# Patient Record
Sex: Male | Born: 1940 | Race: White | Hispanic: Yes | Marital: Married | Smoking: Never smoker
Health system: Southern US, Community
[De-identification: ages and names within clinical notes are randomized; demographics above are authoritative.]

## PROBLEM LIST (undated history)

## (undated) DIAGNOSIS — Z5189 Encounter for other specified aftercare: Secondary | ICD-10-CM

## (undated) DIAGNOSIS — R011 Cardiac murmur, unspecified: Secondary | ICD-10-CM

## (undated) DIAGNOSIS — M1990 Unspecified osteoarthritis, unspecified site: Secondary | ICD-10-CM

## (undated) DIAGNOSIS — J349 Unspecified disorder of nose and nasal sinuses: Secondary | ICD-10-CM

## (undated) DIAGNOSIS — H269 Unspecified cataract: Secondary | ICD-10-CM

## (undated) DIAGNOSIS — I341 Nonrheumatic mitral (valve) prolapse: Secondary | ICD-10-CM

## (undated) DIAGNOSIS — I10 Essential (primary) hypertension: Secondary | ICD-10-CM

## (undated) DIAGNOSIS — E785 Hyperlipidemia, unspecified: Secondary | ICD-10-CM

## (undated) HISTORY — DX: Unspecified cataract: H26.9

## (undated) HISTORY — DX: Hyperlipidemia, unspecified: E78.5

## (undated) HISTORY — DX: Encounter for other specified aftercare: Z51.89

## (undated) HISTORY — DX: Unspecified osteoarthritis, unspecified site: M19.90

## (undated) HISTORY — DX: Unspecified disorder of nose and nasal sinuses: J34.9

## (undated) HISTORY — PX: EYE SURGERY: SHX253

## (undated) HISTORY — DX: Cardiac murmur, unspecified: R01.1

## (undated) HISTORY — DX: Essential (primary) hypertension: I10

## (undated) HISTORY — PX: OTHER SURGICAL HISTORY: SHX169

## (undated) HISTORY — DX: Nonrheumatic mitral (valve) prolapse: I34.1

---

## 2007-05-21 ENCOUNTER — Encounter: Admission: RE | Admit: 2007-05-21 | Discharge: 2007-05-21 | Payer: Self-pay | Admitting: Gastroenterology

## 2009-06-11 IMAGING — CT CT VIRTUAL COLONOSCOPY DIAGNOSTIC
2 of 5 series · 16 of 46 positions shown, 18 images · non-contrast
Comparison: None

CLINICAL DATA: CT VIRTUAL COLONOSCOPY FOR SCREENING:
TECHNIQUE: The patient was given a standard  bowel preparation
with Gastrografin and barium for fluid and stool tagging
respectively.  The quality of the bowel preparation is .moderate.
Automated CO2 insufflation of the colon was performed prior to
image acquisition and colonic distention is moderate.
CLINICAL DATA: CT ABDOMEN AND PELVIS WITHOUT CONTRAST

CT ABDOMEN

[Series 2: supine · axial · 0.70mm/px · z∈[-380,+36]mm · 13 of 371 slices shown, 15 images]
[im 19/371  soft-tissue]
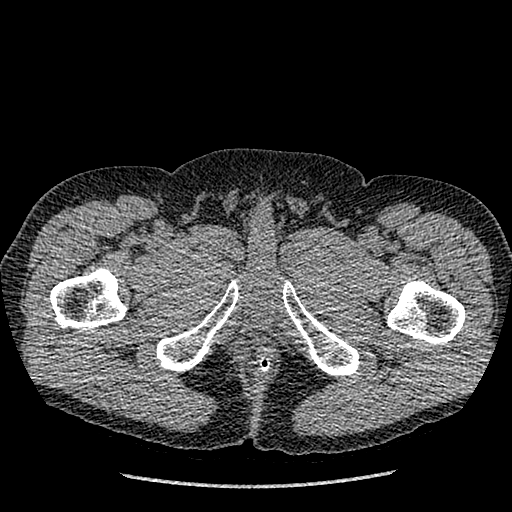
[im 19/371  bone]
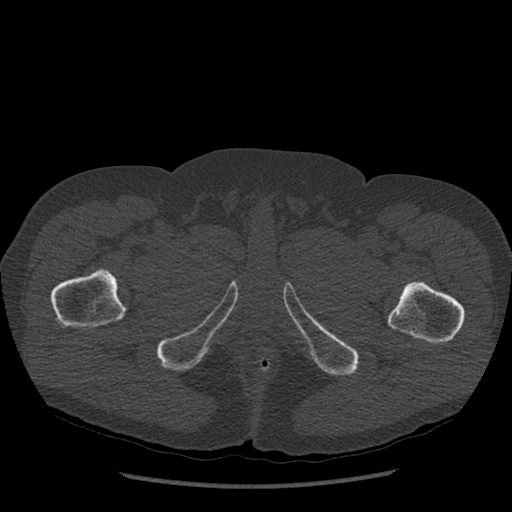
[im 56/371  soft-tissue]
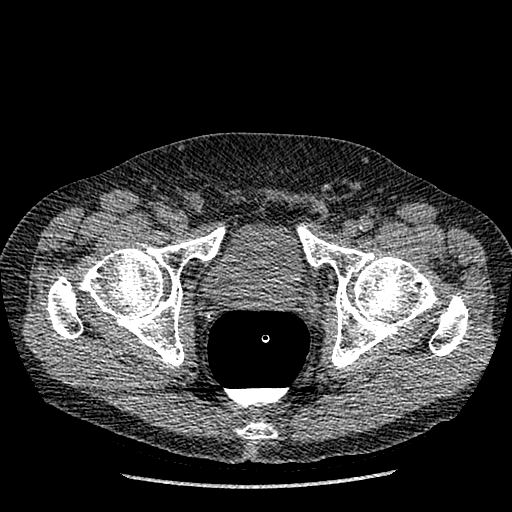
[im 75/371  soft-tissue]
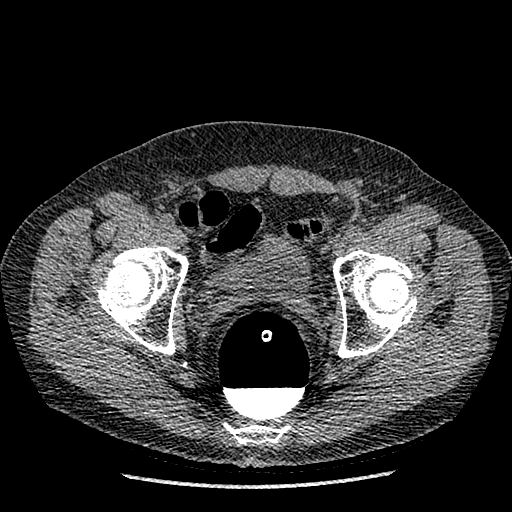
[im 112/371  soft-tissue]
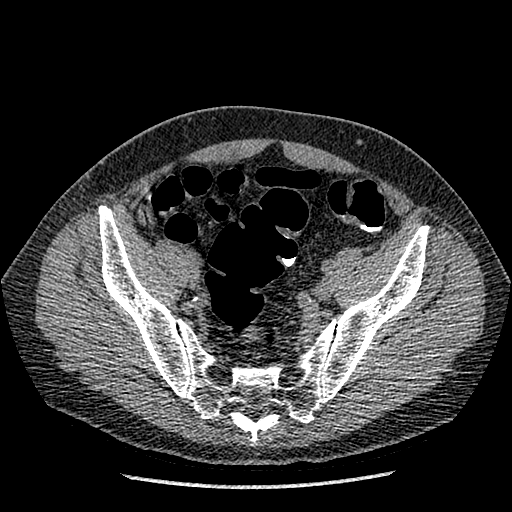
[im 130/371  soft-tissue]
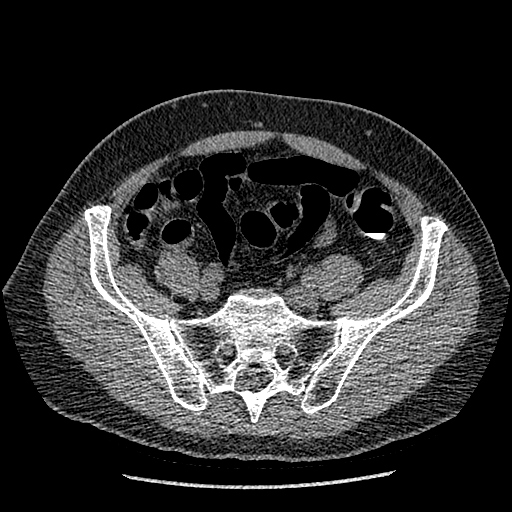
[im 167/371  soft-tissue]
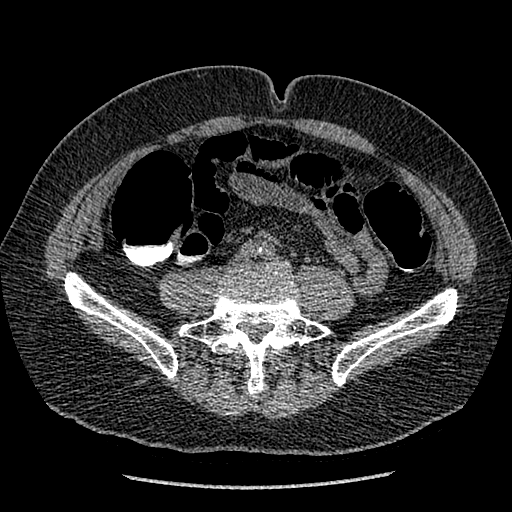
[im 186/371  soft-tissue]
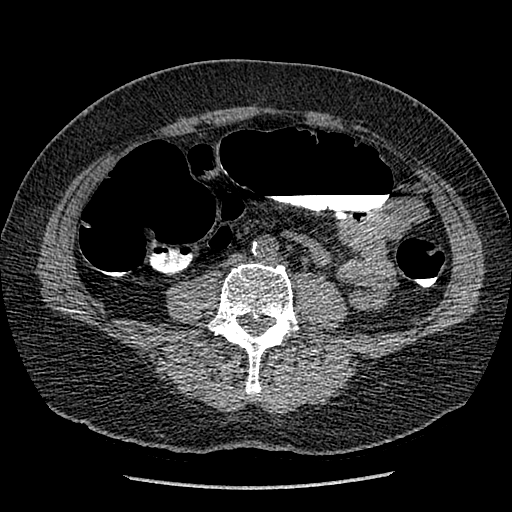
[im 204/371  soft-tissue]
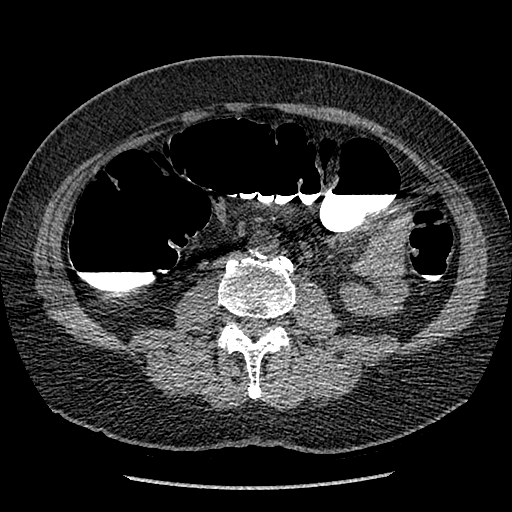
[im 241/371  soft-tissue]
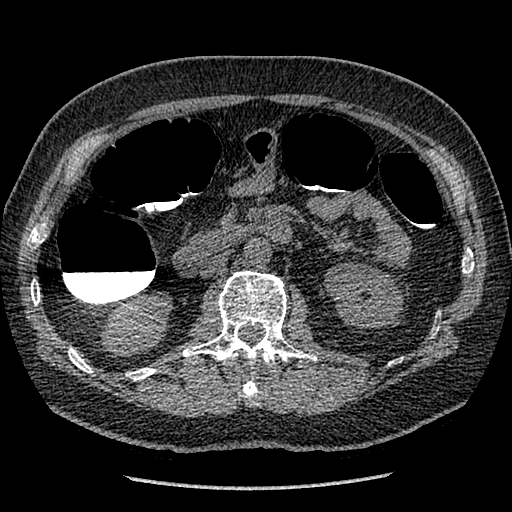
[im 241/371  bone]
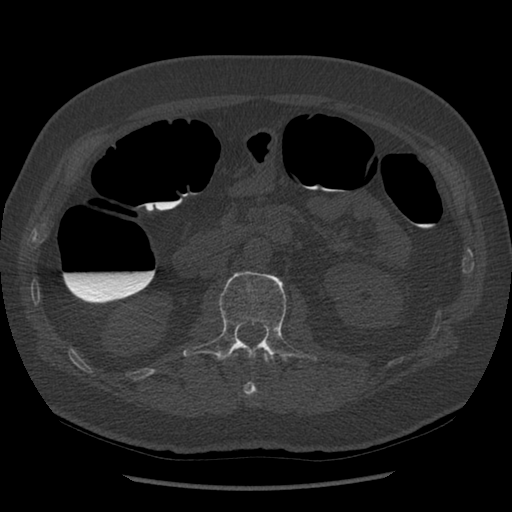
[im 260/371  soft-tissue]
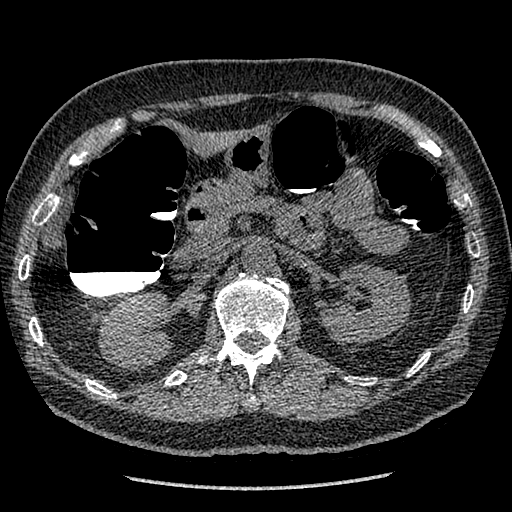
[im 297/371  soft-tissue]
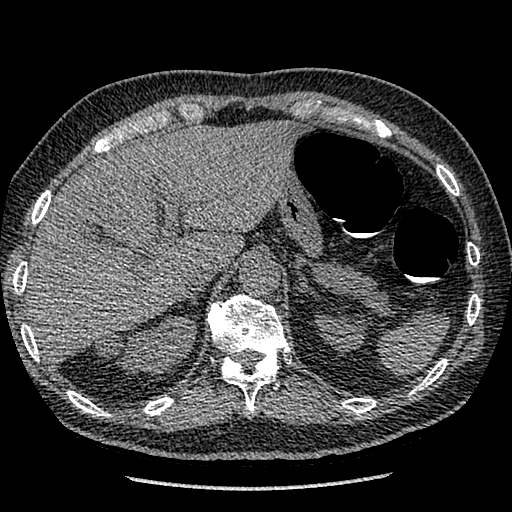
[im 315/371  soft-tissue]
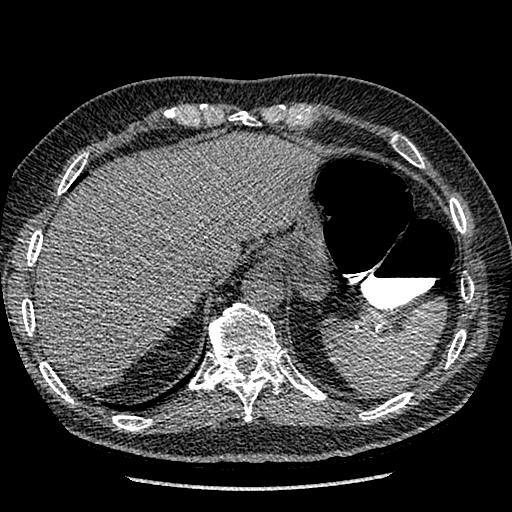
[im 352/371  soft-tissue]
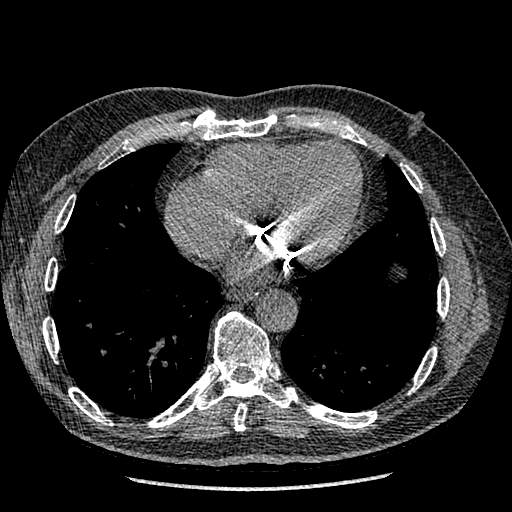

[Series 602: sagittal body · sagittal · 0.90mm/px · 3 of 145 slices shown]
[im 49/145  soft-tissue]
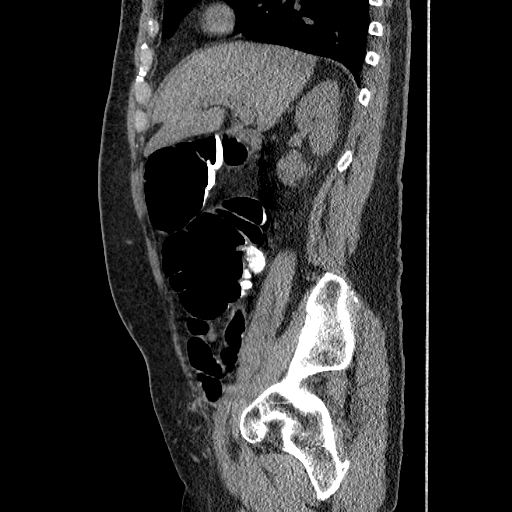
[im 65/145  soft-tissue]
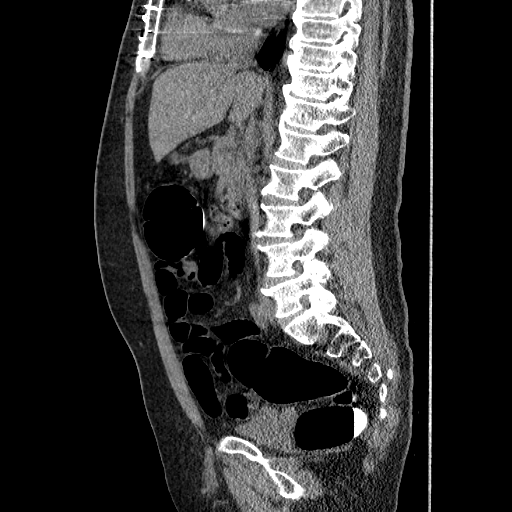
[im 81/145  soft-tissue]
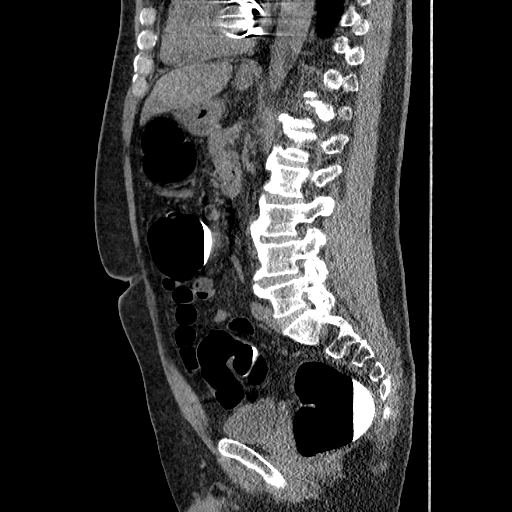

[16 of 46 positions shown; findings below may reference images not displayed]

FINDINGS: During the supine phase of the study the patient became
vasovagal.  Therefore only a supine series could be obtained.  He
was placed in the Trendelenburg position and given IV fluids and
[REDACTED] juice, and over the next and 15 minutes did  respond well.
However after discussing whether or not to proceed with the prone
imaging of the decision was made to forego that part of the exam,
resulting in a somewhat compromised study.

No clinically significant polyp is seen and no constricting lesion
is noted.  Small amount of retained feces and tag to of fluid is
present.  Scattered sigmoid colonic diverticula.

This study is considered a C-Rads category C1,i.e.  normal colon,
continue routine screening.
IMPRESSION: Somewhat compromised study since only the supine portion could be
  performed as described above.  However, no clinically significant
                                polyp is seen.  C-Rads category C1.

Virtual colonoscopy is not designed to detect diminutive polyps
(i.e., less than or equal to 5 mm), the presence or absence of
which may not affect clinical management.
FINDINGS: The lung bases are clear.  The liver appears normal in
the unenhanced state.  No calcified gallstones are seen.  The
pancreas is normal in size and the pancreatic duct is not dilated.
The adrenal glands and spleen appear normal with a few small
calcified splenic granulomas present.  There is a rounded mass
emanating from the upper pole of the right kidney laterally which
has attenuation of [place] units measuring 2.1 cm in diameter.
This probably represents a cyst but is difficult to evaluate on
this unenhanced exam.  Ultrasound may be helpful to assess further.
No hydronephrosis is noted.  Or lumbar spine.
IMPRESSION: 1.  Probable 2.1 cm cyst in the upper pole of the right kidney.
Consider ultrasound to assess further.

CT PELVIS
FINDINGS: No significant abnormality on unenhanced CT the pelvis.
The appendix appears normal.  The urinary bladder is decompressed.
No pelvic mass or adenopathy is seen.
IMPRESSION: No significant abnormality.  Appendix appears normal.

## 2013-03-05 ENCOUNTER — Encounter: Payer: Self-pay | Admitting: Podiatrist

## 2013-03-05 ENCOUNTER — Ambulatory Visit (INDEPENDENT_AMBULATORY_CARE_PROVIDER_SITE_OTHER): Payer: Medicare HMO | Admitting: Podiatrist

## 2013-03-05 VITALS — BP 127/74 | HR 72 | Resp 16

## 2013-03-05 DIAGNOSIS — M216X9 Other acquired deformities of unspecified foot: Secondary | ICD-10-CM

## 2013-03-05 DIAGNOSIS — Q828 Other specified congenital malformations of skin: Secondary | ICD-10-CM

## 2013-03-05 NOTE — Progress Notes (Signed)
   Subjective:    Patient ID: Sean [name], male    DOB: 1941-01-11, 73 y.o.   MRN: [id number]  HPI trim my callus on my ball of the left foot and hurts if I am on it and it gets inflammed and hurts with pressure    Review of Systems  Constitutional: Negative.   HENT: Negative.   Eyes: Negative.   Respiratory: Negative.   Cardiovascular: Negative.   Gastrointestinal: Negative.   Endocrine: Negative.   Genitourinary: Negative.   Musculoskeletal: Negative.   Skin: Negative.   Allergic/Immunologic: Negative.   Neurological: Negative.   Hematological: Bruises/bleeds easily.  Psychiatric/Behavioral: Negative.        Objective:   Physical Exam Neurovascular status is intact with palpable symmetric pedal pulses and neurological sensation intact. Hyperkeratotic lesion is present submetatarsal 5 of the left foot. Once agreed intact integument is noted. It does appear that his shoes are well worn out and the lesion may be attributed to this as well.       Assessment & Plan:  Callus submet 5 left foot  Debrided lesion with a 15 blade without complication.  Padding added to insert of existing shoe and patient instructed on getting new shoes as the ones he has appear worn.

## 2013-07-04 ENCOUNTER — Ambulatory Visit (INDEPENDENT_AMBULATORY_CARE_PROVIDER_SITE_OTHER): Payer: Medicare HMO

## 2013-07-04 VITALS — BP 116/62 | HR 62 | Resp 18

## 2013-07-04 DIAGNOSIS — M216X9 Other acquired deformities of unspecified foot: Secondary | ICD-10-CM

## 2013-07-04 DIAGNOSIS — Q828 Other specified congenital malformations of skin: Secondary | ICD-10-CM

## 2013-07-04 NOTE — Patient Instructions (Signed)
Corns and Calluses A thickening of the skin layer (usually over bony areas, such as toe joints) is known as a corn. Two types of corns exist: hard corns and soft corns. Calluses are painless areas of skin thickening that are caused by repeated pressure or irritation. Corns tend to affect toe joints and the skin between the toes; whereas, a callus can appear on any part of the body (especially the hands, feet, or knees).  SYMPTOMS   Corn:  Presence of a small (1/8 to 3/8 inch [3 to 10 mm in diameter]), painful bump on the side or over the joint of a toe.  Hard corns are more common on the outer portion of the little (fifth) toe at the joint.  Soft corns are more common between bony bumps (prominences), usually between the fourth and fifth toes or between the second and third toes.  Callus:  A rough, thickened area of skin that appears after repeated pressure or irritation. CAUSES  The purpose of corns and calluses is to protect an area of skin from injury caused by repeated irritation (rubbing or squeezing). The presence of pressure causes the skin cells to grow at a faster rate than the cells of unaffected areas. This leads to an overgrowth (corn or callus). As apposed to hard corns, soft corns tend to develop between toes, because there is more moisture. Soft corns are often the result of prolonged shoe wear, which leads to increased perspiration and moisture.  RISK INCREASES WITH:  Shoes that are too tight.  Occupations or sports that involve repetitive pressure on the hands (racquetball and baseball) or sudden stops on hard surfaces (track and tennis).  Sports that require the athlete to wear shoes, perspire, or wear clothing or protective gear that causes the production of heat and friction. PREVENTION  Properly fitted shoes and equipment.  Modify activities to prevent constant pressure on specific areas of skin.  If possible, wear padding over areas of skin that are exposed to  repeated pressure or irritation.  Keep the area between the toes dry (with powder or by removing shoes often).  Relieve shoe pressure by stretching the areas of the shoe that cause the pressure and or use ointments to soften leather shoes. PROGNOSIS  Corns and calluses typically subside if the activity that causes them is eliminated. Recovery may take up to 3 weeks. Recurrence is likely even with treatment if the cause is not removed.  RELATED COMPLICATIONS  If one overcompensates in an attempt to avoid pain, he or she may experience pain in other areas due to the changes in body movements (mechanics). TREATMENT  The best way to treat corns and calluses is to remove the source of pressure. Corn and callus pads may be helpful in reducing pressure on the affected skin. For soft corns, try to keep the affected area dry. If you cannot find shoes that fit properly, a shoe repair shop may be able to alter your shoes to reduce pressure. Occasionally a cushion for the bottom of the foot (metatarsal bar) worn within the shoe may relieve pressure on corns or calluses of the foot. For calluses, you may be able to peel or rub the thickened area with a pumice stone, sandstone, callus file, or with sandpaper to remove the callus; wetting the affected area may make this process more effective. Do not cut the corn or callus with a razor or knife. If the corn or callus must be removed, then a medically trained person should perform   the procedure. After peeling away the upper layers of a corn once or twice a day, it may be recommended to apply a non-prescription 5% to 10% salicylic ointment and cover the area with a bandage. It very uncommon to have the bony bumps (at toe joints) surgically removed. MEDICATION   If pain medication is necessary, nonsteroidal anti-inflammatory medications, such as aspirin and ibuprofen, or other minor pain relievers, such as acetaminophen, are often recommended. Contact your caregiver  immediately if any bleeding, stomach upset, or signs of an allergic reaction occur.  Topical salicylic ointments (5% to 10%) may be of benefit.  Prescription pain medications may be given by a caregiver. Use only as directed and only as much as you need.  Soak the foot for 20 minutes, twice a day, in a gallon of warm water. This may help to soften corns and calluses. Care should be taken to thoroughly dry the foot, especially between the toes, after soaking. SEEK MEDICAL CARE IF:   Symptoms get worse or do not improve in 2 weeks despite treatment.  Any signs of infection develop, including redness, swelling, increased pain or tenderness, or increased warmth around the corn or callus.  New, unexplained symptoms develop (drugs used in treatment may produce side effects). Document Released: 01/31/2005 Document Revised: 04/25/2011 Document Reviewed: 05/15/2008 Exit[employer] Patient Information 2014 [employer].  

## 2013-07-04 NOTE — Progress Notes (Signed)
   Subjective:    Patient ID: Sean [name], male    DOB: 02-Feb-1941, 73 y.o.   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

## 2014-04-14 ENCOUNTER — Ambulatory Visit (INDEPENDENT_AMBULATORY_CARE_PROVIDER_SITE_OTHER): Payer: Medicare HMO

## 2014-04-14 VITALS — BP 129/78 | HR 70 | Resp 18

## 2014-04-14 DIAGNOSIS — M216X1 Other acquired deformities of right foot: Secondary | ICD-10-CM

## 2014-04-14 DIAGNOSIS — Q828 Other specified congenital malformations of skin: Secondary | ICD-10-CM

## 2014-04-14 DIAGNOSIS — M2041 Other hammer toe(s) (acquired), right foot: Secondary | ICD-10-CM

## 2014-04-14 NOTE — Progress Notes (Signed)
   Subjective:    Patient ID: Sean [name], male    DOB: February 28, 1940, 74 y.o.   MRN: [id number]  HPI I HAVE A CORN ON MY 5TH RIGHT TOE AND I WEAR A SPACER INBETWEEN MY 4TH AND 5TH TOE AND IT ITCHES AND STINGS AND BURNS AND BY THE END OF THE DAY I AM LIMPING AND ACHES AND I GOT NEW SHOES AND IT DOESN'T REALLY HELP    Review of Systems no new findings or systemic changes noted     Objective:   Physical Exam Neurovascular status unchanged pedal pulses are palpable DP +2 over 4 PT 1 over 4 bilateral Refill time 3 seconds on exam this time there still some splaying of the third and fourth toes with lateral deviation of the fourth and fifth digits there is keratoses on the circumflex on the distal IP joint both medial and lateral fifth toe distal IP joint. There is hemorrhage a keratoses medially consistent with a soft corn in a poor keratotic lesion corn almost blisters corn lateral fifth toe at the IP joint. She's been wearing a toe separator foam pads or [employer] been several months since patient was last seen this pain continues. Problem although is wearing certain shoes like the new balance they're more accommodating and working better however continues to have pain and recurrence of keratoses. 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 This time the keratotic lesions are debrided silicone sleeve or tube foam pads are dispensed to cushion the toe and prevent recurrence however this continues to be an issue may be candidate for possible ostectomy or exostectomy of the distal phalanx exostoses fifth digit right foot will consider whether is improving or not if no improvement follow-up for possible surgical intervention as recommended otherwise consider  continue conservative and accommodative therapies multiple silicone pads and tube foam pads dispensed  Harriet Masson DPM

## 2014-05-26 ENCOUNTER — Ambulatory Visit (INDEPENDENT_AMBULATORY_CARE_PROVIDER_SITE_OTHER): Payer: Medicare HMO | Admitting: Podiatry

## 2014-05-26 ENCOUNTER — Ambulatory Visit: Payer: Medicare HMO

## 2014-05-26 ENCOUNTER — Encounter: Payer: Self-pay | Admitting: Podiatry

## 2014-05-26 VITALS — BP 131/75 | HR 61 | Resp 18

## 2014-05-26 DIAGNOSIS — M2041 Other hammer toe(s) (acquired), right foot: Secondary | ICD-10-CM | POA: Diagnosis not present

## 2014-05-26 DIAGNOSIS — D169 Benign neoplasm of bone and articular cartilage, unspecified: Secondary | ICD-10-CM | POA: Diagnosis not present

## 2014-05-27 ENCOUNTER — Telehealth: Payer: Self-pay | Admitting: *Deleted

## 2014-05-27 NOTE — Progress Notes (Signed)
Subjective:     Patient ID: Sean [name], male   DOB: 1940/08/12, 74 y.o.   MRN: [id number]  HPI patient presents with spur on the right fifth toe and fourth toe that had been very painful for him and states that he cannot take the pain anymore and the trimming and padding are not working and he cannot wear shoe gear   Review of Systems     Objective:   Physical Exam Neurovascular status intact muscle strength adequate range of motion within normal limits. Patient has severe keratotic lesion distal fifth toe right medial side and fourth toe right there is a keratotic lesion on the inner side of the toe that's painful. 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 He's not interested in any more conservative options and wants this fixed and I recommended exostectomy fifth digit right and arthroplasty fourth digit right. I reviewed the procedures and reviewed risk and patient wants surgery understanding all complications as outlined in the consent form and is willing to accept risk and signs consent form. Patient will work with his family doctor concerning his Coumadin and we will have him stop it 4 days before and begin Lovenox which will be coordinated by his family physician he will call if he has any questions or his physician is encouraged to call if there's any questions. Patient understands total recovery. We'll take 6 months to one year

## 2014-05-27 NOTE — Telephone Encounter (Signed)
"  I'm calling to see when I can schedule surgery."  We have you scheduled for 06/10/2014.  "What time do I need to be there?"  The surgical center will call you a few days before and let you know exactly what time to get there.  "Okay, that is great.  I will have this surgery that Wednesday and have surgery on my neck the next day.  That way I will only have to be off Coumadin one time."

## 2014-06-06 ENCOUNTER — Telehealth: Payer: Self-pay | Admitting: *Deleted

## 2014-06-06 NOTE — Telephone Encounter (Signed)
I called and spoke to [facility] to get authorization of patient's surgery.  She took information and then informed me I needed to speak to someone from [place] to get procedure 20285 authorized.  [id number] doesn't need authorization.  I spoke to Lyman at [facility] and she stated I had to fill out a form.  She's going to fax form and requested I send clinical notes to support.

## 2014-06-09 NOTE — Telephone Encounter (Signed)
I called to check on the status of authorization for Hammertoe procedure.  "It's still under review."

## 2014-06-09 NOTE — Telephone Encounter (Signed)
I called to check on status of authorization for [name].  "It has been approved.  It's good from 06/09/2014 - 07/25/2014.  Authorization number is [id number].    I called and informed Sean [name] about authorization.  "Okay, I'll make a note of it.  Thank you."

## 2014-06-10 DIAGNOSIS — M2041 Other hammer toe(s) (acquired), right foot: Secondary | ICD-10-CM | POA: Diagnosis not present

## 2014-06-10 DIAGNOSIS — M25774 Osteophyte, right foot: Secondary | ICD-10-CM | POA: Diagnosis not present

## 2014-06-16 ENCOUNTER — Ambulatory Visit (INDEPENDENT_AMBULATORY_CARE_PROVIDER_SITE_OTHER): Payer: Medicare HMO

## 2014-06-16 ENCOUNTER — Encounter: Payer: Self-pay | Admitting: Podiatry

## 2014-06-16 ENCOUNTER — Ambulatory Visit (INDEPENDENT_AMBULATORY_CARE_PROVIDER_SITE_OTHER): Payer: Medicare HMO | Admitting: Podiatry

## 2014-06-16 VITALS — BP 153/71 | HR 75 | Resp 15

## 2014-06-16 DIAGNOSIS — Z9889 Other specified postprocedural states: Secondary | ICD-10-CM

## 2014-06-16 DIAGNOSIS — B351 Tinea unguium: Secondary | ICD-10-CM

## 2014-06-16 DIAGNOSIS — M2041 Other hammer toe(s) (acquired), right foot: Secondary | ICD-10-CM

## 2014-06-16 DIAGNOSIS — D169 Benign neoplasm of bone and articular cartilage, unspecified: Secondary | ICD-10-CM

## 2014-06-17 NOTE — Progress Notes (Signed)
Subjective:     Patient ID: Sean [name], male   DOB: 02-16-1940, 74 y.o.   MRN: [id number]  HPI patient states I'm doing real well with my surgery and having minimal discomfort or swelling   Review of Systems     Objective:   Physical Exam Neurovascular status intact negative Homans sign noted with fourth and fifth toe digits and good alignment with wound edges well coapted and stitches in place    Assessment:     Doing well post arthroplasty fourth exostectomy fifth toe right    Plan:     Reviewed conditions and reapplied sterile dressing after reviewing x-rays. Continue with elevation and reappoint 2 weeks for suture removal or earlier if any issues should occur

## 2014-06-30 ENCOUNTER — Encounter: Payer: Self-pay | Admitting: Podiatry

## 2014-06-30 ENCOUNTER — Ambulatory Visit (INDEPENDENT_AMBULATORY_CARE_PROVIDER_SITE_OTHER): Payer: Medicare HMO | Admitting: Podiatry

## 2014-06-30 DIAGNOSIS — M2041 Other hammer toe(s) (acquired), right foot: Secondary | ICD-10-CM

## 2014-06-30 NOTE — Progress Notes (Signed)
Subjective:     Patient ID: Sean [name], male   DOB: 08/21/1940, 74 y.o.   MRN: [id number]  HPI patient states I'm doing great with my toes   Review of Systems     Objective:   Physical Exam Neurovascular status intact muscle strength adequate with excellent healing of the fourth and fifth toes right foot with digits and good alignment wound edges well coapted and no pain    Assessment:     Doing well post arthroplasty fourth exostectomy fifth right foot    Plan:     Reviewed condition and recommended return to normal activity and gradual return to work in the next 2 weeks. Doing very well with surgery and will be seen back as needed

## 2016-02-24 ENCOUNTER — Ambulatory Visit (INDEPENDENT_AMBULATORY_CARE_PROVIDER_SITE_OTHER): Payer: Medicare HMO | Admitting: Sports Medicine

## 2016-02-24 ENCOUNTER — Ambulatory Visit (INDEPENDENT_AMBULATORY_CARE_PROVIDER_SITE_OTHER): Payer: Medicare HMO

## 2016-02-24 ENCOUNTER — Encounter: Payer: Self-pay | Admitting: Sports Medicine

## 2016-02-24 DIAGNOSIS — M79671 Pain in right foot: Secondary | ICD-10-CM

## 2016-02-24 DIAGNOSIS — M79672 Pain in left foot: Secondary | ICD-10-CM

## 2016-02-24 DIAGNOSIS — G629 Polyneuropathy, unspecified: Secondary | ICD-10-CM | POA: Diagnosis not present

## 2016-02-24 MED ORDER — GABAPENTIN 300 MG PO CAPS
300.0000 mg | ORAL_CAPSULE | Freq: Every day | ORAL | 3 refills | Status: DC
Start: 2016-02-24 — End: 2016-03-24

## 2016-02-24 NOTE — Progress Notes (Signed)
Subjective: Sean [name] is a 76 y.o. male patient who presents to office for evaluation of bilateral foot pain. Patient complains of progressive pain especially over the last few months as sharp, stabbing, tingling pain to all toes on both feet worse after work at end of day. Patient has tried change of shoes with no relief in symptoms. Patient denies any other pedal complaints. Denies injury/trip/fall/sprain/any causative factors.   There are no active problems to display for this patient.   Current Outpatient Prescriptions on File Prior to Visit  Medication Sig Dispense Refill  . acetaminophen (TYLENOL) 325 MG tablet Take by mouth.    [place] PRESERVATIVE FREE injection     . aspirin 81 MG chewable tablet Chew 81 mg by mouth.    . AVODART 0.5 MG capsule     . cefUROXime (CEFTIN) 250 MG tablet     . clotrimazole-betamethasone (LOTRISONE) lotion     . COUMADIN 4 MG tablet     . COUMADIN 5 MG tablet   0  . dutasteride (AVODART) 0.5 MG capsule     . Influenza Vac Split High-Dose 0.5 ML SUSY     . metoprolol (LOPRESSOR) 50 MG tablet     . metoprolol (LOPRESSOR) 50 MG tablet     . mupirocin ointment (BACTROBAN) 2 %   0  . nitroGLYCERIN (NITROSTAT) 0.4 MG SL tablet     . simvastatin (ZOCOR) 80 MG tablet     . simvastatin (ZOCOR) 80 MG tablet     . sulfamethoxazole-trimethoprim (BACTRIM DS,SEPTRA DS) 800-160 MG per tablet Take 1 tablet by mouth 2 (two) times daily. for 10 days  0  . traMADol (ULTRAM) 50 MG tablet     . traMADol (ULTRAM) 50 MG tablet     . Vitamin D, Ergocalciferol, (DRISDOL) 50000 UNITS CAPS capsule   0  . warfarin (COUMADIN) 4 MG tablet     . ZOSTAVAX 29562 UNT/0.65ML injection      No current facility-administered medications on file prior to visit.     Allergies  Allergen Reactions  . Demerol [Meperidine] Nausea And Vomiting    Objective:  General: Alert and oriented x3 in no acute distress  Dermatology: No open lesions bilateral lower extremities, no webspace  macerations, no ecchymosis bilateral, all nails x 10 are well manicured.  Vascular: Dorsalis Pedis and Posterior Tibial pedal pulses palpable, Capillary Fill Time 3 seconds,(+) pedal hair growth bilateral, no edema bilateral lower extremities, Temperature gradient within normal limits.  Neurology: Gross sensation intact via light touch bilateral, Protective sensation intact with Semmes Weinstein Monofilament to all pedal sites, vibratory diminished bilateral, No babinski sign present bilateral. (- )Tinels sign bilateral.   Musculoskeletal: No reproducible tenderness to both feet, subjective burning, tingling, sharp pain all toes,No pain with calf compression bilateral. Range of motion within normal limits except at midtarsal joint and at hammertoes. Strength within normal limits in all groups bilateral.   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   Assessment and Plan: Problem List Items Addressed This Visit    None    Visit Diagnoses    Neuropathy ([place])    -  Primary   Relevant Medications   gabapentin (NEURONTIN) 300 MG capsule   Pain in both feet       Relevant Medications   gabapentin (NEURONTIN) 300 MG capsule   Other Relevant Orders   DG Foot 2  Views Left   DG Foot 2 Views Right       -Complete examination performed -Xrays reviewed -Discussed treatement options for arthritis and neuropathy symptoms -Rx Gabapentin 300mg  QHS  -Recommend continue with good supportive shoes and inserts -Recommend soaking as needed  -Patient to return to office in 4-6 weeks for medication check or sooner if condition worsens.  Landis Martins, DPM

## 2016-03-24 ENCOUNTER — Ambulatory Visit (INDEPENDENT_AMBULATORY_CARE_PROVIDER_SITE_OTHER): Payer: Medicare HMO | Admitting: Sports Medicine

## 2016-03-24 ENCOUNTER — Encounter: Payer: Self-pay | Admitting: Sports Medicine

## 2016-03-24 DIAGNOSIS — M79671 Pain in right foot: Secondary | ICD-10-CM

## 2016-03-24 DIAGNOSIS — G629 Polyneuropathy, unspecified: Secondary | ICD-10-CM | POA: Diagnosis not present

## 2016-03-24 DIAGNOSIS — M79672 Pain in left foot: Secondary | ICD-10-CM

## 2016-03-24 MED ORDER — GABAPENTIN 300 MG PO CAPS: 300.0000 mg | ORAL_CAPSULE | Freq: Two times a day (BID) | ORAL | 3 refills | Status: DC

## 2016-03-24 NOTE — Progress Notes (Signed)
Subjective: Sean [name] is a 76 y.o. male patient who returns to office for evaluation of bilateral foot pain that is sharp, stabbing, tingling pain to all toes on both feet worse after work at end of day. Patient states that the pain is about the same intensity with Gabapentin 300 Qhs. States that soaks help. Admits also difficulty with callus and pain also to balls of both feet. No other issues.  There are no active problems to display for this patient.   Current Outpatient Prescriptions on File Prior to Visit  Medication Sig Dispense Refill  . acetaminophen (TYLENOL) 325 MG tablet Take by mouth.    [facility] [place] PRESERVATIVE FREE injection     . aspirin 81 MG chewable tablet Chew 81 mg by mouth.    . AVODART 0.5 MG capsule     . cefUROXime (CEFTIN) 250 MG tablet     . clotrimazole-betamethasone (LOTRISONE) lotion     . COUMADIN 4 MG tablet     . COUMADIN 5 MG tablet   0  . dutasteride (AVODART) 0.5 MG capsule     . Influenza Vac Split High-Dose 0.5 ML SUSY     . metoprolol (LOPRESSOR) 50 MG tablet     . metoprolol (LOPRESSOR) 50 MG tablet     . mupirocin ointment (BACTROBAN) 2 %   0  . nitroGLYCERIN (NITROSTAT) 0.4 MG SL tablet     . simvastatin (ZOCOR) 80 MG tablet     . simvastatin (ZOCOR) 80 MG tablet     . sulfamethoxazole-trimethoprim (BACTRIM DS,SEPTRA DS) 800-160 MG per tablet Take 1 tablet by mouth 2 (two) times daily. for 10 days  0  . traMADol (ULTRAM) 50 MG tablet     . traMADol (ULTRAM) 50 MG tablet     . Vitamin D, Ergocalciferol, (DRISDOL) 50000 UNITS CAPS capsule   0  . warfarin (COUMADIN) 4 MG tablet     . ZOSTAVAX 44818 UNT/0.65ML injection      No current facility-administered medications on file prior to visit.     Allergies  Allergen Reactions  . Demerol [Meperidine] Nausea And Vomiting    Objective:  General: Alert and oriented x3 in no acute distress  Dermatology: Minimal reactive keratosis sub met 1 and 5 bilateral. No open lesions bilateral lower  extremities, no webspace macerations, no ecchymosis bilateral, all nails x 10 are well manicured.  Vascular: Dorsalis Pedis and Posterior Tibial pedal pulses palpable, Capillary Fill Time 3 seconds,(+) pedal hair growth bilateral, no edema bilateral lower extremities, Temperature gradient within normal limits.  Neurology: Gross sensation intact via light touch bilateral, Protective sensation intact with Semmes Weinstein Monofilament to all pedal sites, vibratory diminished bilateral, No babinski sign present bilateral. (- )Tinels sign bilateral. Subjective sharp pain to all toes.   Musculoskeletal: No reproducible tenderness to palpation to both feet, subjective burning, tingling, sharp pain all toes,No pain with calf compression bilateral. Range of motion within normal limits except at midtarsal joint and at hammertoes. Strength within normal limits in all groups bilateral.   Assessment and Plan: Problem List Items Addressed This Visit    None    Visit Diagnoses    Pain in both feet       Relevant Medications   gabapentin (NEURONTIN) 300 MG capsule   Neuropathy ([facility])       Relevant Medications   gabapentin (NEURONTIN) 300 MG capsule       -Complete examination performed -Re-Discussed treatement options for arthritis and neuropathy symptoms -Increased Gabapentin to  377m BID -Recommend continue with good supportive shoes and inserts, added met pad to shoes -Recommend soaking as needed for pain relief across arthritic joints  -Recommend vit b supplimentation  -Recommend okeeffe healthy feet  -Patient to return to office in 4-6 weeks for medication check or sooner if condition worsens.  TLandis Martins DPM

## 2016-05-05 ENCOUNTER — Ambulatory Visit: Payer: Medicare HMO | Admitting: Sports Medicine

## 2016-05-19 ENCOUNTER — Encounter: Payer: Self-pay | Admitting: Sports Medicine

## 2016-05-19 ENCOUNTER — Ambulatory Visit (INDEPENDENT_AMBULATORY_CARE_PROVIDER_SITE_OTHER): Payer: Medicare HMO | Admitting: Sports Medicine

## 2016-05-19 VITALS — BP 112/61 | HR 65 | Resp 18

## 2016-05-19 DIAGNOSIS — M79671 Pain in right foot: Secondary | ICD-10-CM | POA: Diagnosis not present

## 2016-05-19 DIAGNOSIS — M79672 Pain in left foot: Secondary | ICD-10-CM

## 2016-05-19 DIAGNOSIS — G629 Polyneuropathy, unspecified: Secondary | ICD-10-CM | POA: Diagnosis not present

## 2016-05-19 NOTE — Progress Notes (Signed)
Subjective: Sean [name] is a 76 y.o. male patient who returns to office for follow up evaluation of bilateral foot pain that is sharp, stabbing, tingling pain to all toes on both feet worse after work at end of day; states pain is better only happens once every 2 weeks. Patient states that he did not increase Gabapentin to 300 bid because he already takes a bunch of meds and felt like he didn't need to because pain was better. No other issues.  There are no active problems to display for this patient.   Current Outpatient Prescriptions on File Prior to Visit  Medication Sig Dispense Refill  . acetaminophen (TYLENOL) 325 MG tablet Take by mouth.    [facility] [place] PRESERVATIVE FREE injection     . aspirin 81 MG chewable tablet Chew 81 mg by mouth.    . AVODART 0.5 MG capsule     . cefUROXime (CEFTIN) 250 MG tablet     . clotrimazole-betamethasone (LOTRISONE) lotion     . COUMADIN 4 MG tablet     . COUMADIN 5 MG tablet   0  . dutasteride (AVODART) 0.5 MG capsule     . gabapentin (NEURONTIN) 300 MG capsule Take 1 capsule (300 mg total) by mouth 2 (two) times daily. 90 capsule 3  . Influenza Vac Split High-Dose 0.5 ML SUSY     . metoprolol (LOPRESSOR) 50 MG tablet     . metoprolol (LOPRESSOR) 50 MG tablet     . mupirocin ointment (BACTROBAN) 2 %   0  . nitroGLYCERIN (NITROSTAT) 0.4 MG SL tablet     . simvastatin (ZOCOR) 80 MG tablet     . simvastatin (ZOCOR) 80 MG tablet     . sulfamethoxazole-trimethoprim (BACTRIM DS,SEPTRA DS) 800-160 MG per tablet Take 1 tablet by mouth 2 (two) times daily. for 10 days  0  . tamsulosin (FLOMAX) 0.4 MG CAPS capsule     . traMADol (ULTRAM) 50 MG tablet     . traMADol (ULTRAM) 50 MG tablet     . Vitamin D, Ergocalciferol, (DRISDOL) 50000 UNITS CAPS capsule   0  . warfarin (COUMADIN) 4 MG tablet     . ZOSTAVAX 88916 UNT/0.65ML injection      No current facility-administered medications on file prior to visit.     Allergies  Allergen Reactions  . Demerol  [Meperidine] Nausea And Vomiting    Objective:  General: Alert and oriented x3 in no acute distress  Dermatology: Minimal reactive keratosis sub met 1 and 5 bilateral. No open lesions bilateral lower extremities, no webspace macerations, no ecchymosis bilateral, all nails x 10 are well manicured.  Vascular: Dorsalis Pedis and Posterior Tibial pedal pulses palpable, Capillary Fill Time 3 seconds,(+) pedal hair growth bilateral, no edema bilateral lower extremities, Temperature gradient within normal limits.  Neurology: Gross sensation intact via light touch bilateral, Protective sensation intact with Semmes Weinstein Monofilament to all pedal sites, vibratory diminished bilateral, No babinski sign present bilateral. (- )Tinels sign bilateral. Subjective sharp pain to all toes.   Musculoskeletal: No reproducible tenderness to palpation to both feet, subjective occassional burning, tingling, sharp pain all toes,No pain with calf compression bilateral. Range of motion within normal limits except at midtarsal joint and at hammertoes. Strength within normal limits in all groups bilateral.   Assessment and Plan: Problem List Items Addressed This Visit    None    Visit Diagnoses    Neuropathy ([place])    -  Primary   Pain in both  feet           -Complete examination performed -Re-Discussed treatement options and continued care for arthritis and neuropathy symptoms -Continue with Gabapentin to 353m qhs may increase to bid as needed; Patient to call office if he needs refill or if he runs out before next visit  -Recommend continue with good supportive shoes and inserts -Recommend continue soaking with epsom salt as needed for pain relief across arthritic joints  -Recommend continue with vit b supplimentation  -Recommend continue with okeeffe healthy feet  -Patient to return to office in 6 months for medication check or sooner if condition worsens.  TLandis Martins DPM

## 2016-11-16 ENCOUNTER — Ambulatory Visit: Payer: Medicare HMO | Admitting: Sports Medicine

## 2016-11-30 ENCOUNTER — Ambulatory Visit: Payer: Medicare HMO | Admitting: Sports Medicine

## 2016-12-15 ENCOUNTER — Ambulatory Visit (INDEPENDENT_AMBULATORY_CARE_PROVIDER_SITE_OTHER): Payer: Medicare HMO | Admitting: Sports Medicine

## 2016-12-15 DIAGNOSIS — M79671 Pain in right foot: Secondary | ICD-10-CM

## 2016-12-15 DIAGNOSIS — M79672 Pain in left foot: Secondary | ICD-10-CM

## 2016-12-15 DIAGNOSIS — G629 Polyneuropathy, unspecified: Secondary | ICD-10-CM

## 2016-12-15 MED ORDER — GABAPENTIN 300 MG PO CAPS: 300.0000 mg | ORAL_CAPSULE | Freq: Two times a day (BID) | ORAL | 3 refills | Status: DC

## 2016-12-15 NOTE — Progress Notes (Signed)
Subjective: Sean [name] is a 76 y.o. male patient who returns to office for follow up evaluation of bilateral foot pain that is sharp, stabbing, tingling pain to all toes on both feet worse after work at end of day; states pain is better with Gabapentin and that soaking has helped his feet tremendously.  Patient reports that he sometimes gets calluses and wants to know what more can be done for them. No other issues.  There are no active problems to display for this patient.   Current Outpatient Prescriptions on File Prior to Visit  Medication Sig Dispense Refill  . acetaminophen (TYLENOL) 325 MG tablet Take by mouth.    [facility] [place] PRESERVATIVE FREE injection     . aspirin 81 MG chewable tablet Chew 81 mg by mouth.    . AVODART 0.5 MG capsule     . cefUROXime (CEFTIN) 250 MG tablet     . clotrimazole-betamethasone (LOTRISONE) lotion     . COUMADIN 4 MG tablet     . COUMADIN 5 MG tablet   0  . dutasteride (AVODART) 0.5 MG capsule     . gabapentin (NEURONTIN) 300 MG capsule Take 1 capsule (300 mg total) by mouth 2 (two) times daily. 90 capsule 3  . Influenza Vac Split High-Dose 0.5 ML SUSY     . metoprolol (LOPRESSOR) 50 MG tablet     . metoprolol (LOPRESSOR) 50 MG tablet     . mupirocin ointment (BACTROBAN) 2 %   0  . nitroGLYCERIN (NITROSTAT) 0.4 MG SL tablet     . simvastatin (ZOCOR) 80 MG tablet     . simvastatin (ZOCOR) 80 MG tablet     . sulfamethoxazole-trimethoprim (BACTRIM DS,SEPTRA DS) 800-160 MG per tablet Take 1 tablet by mouth 2 (two) times daily. for 10 days  0  . tamsulosin (FLOMAX) 0.4 MG CAPS capsule     . traMADol (ULTRAM) 50 MG tablet     . traMADol (ULTRAM) 50 MG tablet     . Vitamin D, Ergocalciferol, (DRISDOL) 50000 UNITS CAPS capsule   0  . warfarin (COUMADIN) 4 MG tablet     . ZOSTAVAX 31540 UNT/0.65ML injection      No current facility-administered medications on file prior to visit.     Allergies  Allergen Reactions  . Demerol [Meperidine] Nausea And  Vomiting    Objective:  General: Alert and oriented x3 in no acute distress  Dermatology: Minimal reactive keratosis sub met 1 and 5 bilateral. No open lesions bilateral lower extremities, no webspace macerations, no ecchymosis bilateral, all nails x 10 are well manicured.  Vascular: Dorsalis Pedis and Posterior Tibial pedal pulses palpable, Capillary Fill Time 3 seconds,(+) pedal hair growth bilateral, no edema bilateral lower extremities, Temperature gradient within normal limits.  Neurology: Gross sensation intact via light touch bilateral, Protective sensation intact with Semmes Weinstein Monofilament to all pedal sites, vibratory diminished bilateral, No babinski sign present bilateral. (- )Tinels sign bilateral. Subjective sharp pain to all toes.   Musculoskeletal: No reproducible tenderness to palpation to both feet, subjective occassional burning, tingling, sharp pain all toes,No pain with calf compression bilateral. Range of motion within normal limits except at midtarsal joint and at hammertoes. Strength within normal limits in all groups bilateral.   Assessment and Plan: Problem List Items Addressed This Visit    None    Visit Diagnoses    Neuropathy    -  Primary   Pain in both feet          -  Complete examination performed -Re-Discussed treatement options and continued care for arthritis and neuropathy symptoms -Continue with Gabapentin to '300mg'$  qhs may increase to bid as needed; Patient to call office if he needs refill or if he runs out before next visit -Recommend continue with good supportive shoes and inserts -Recommend continue soaking with epsom salt as needed for pain relief across arthritic joints  -Recommend continue with vit b supplimentation  -Recommend continue with okeeffe healthy feet and continue with pumice stone and good supportive shoes -Patient to return to office in 12 months for medication check or sooner if condition worsens.  Landis Martins,  DPM

## 2016-12-23 DIAGNOSIS — I2510 Atherosclerotic heart disease of native coronary artery without angina pectoris: Secondary | ICD-10-CM | POA: Insufficient documentation

## 2016-12-23 DIAGNOSIS — Z952 Presence of prosthetic heart valve: Secondary | ICD-10-CM | POA: Insufficient documentation

## 2016-12-23 DIAGNOSIS — I480 Paroxysmal atrial fibrillation: Secondary | ICD-10-CM | POA: Insufficient documentation

## 2016-12-23 DIAGNOSIS — Z7901 Long term (current) use of anticoagulants: Secondary | ICD-10-CM | POA: Insufficient documentation

## 2016-12-23 DIAGNOSIS — R5383 Other fatigue: Secondary | ICD-10-CM | POA: Insufficient documentation

## 2016-12-23 DIAGNOSIS — Z955 Presence of coronary angioplasty implant and graft: Secondary | ICD-10-CM | POA: Insufficient documentation

## 2017-11-01 DIAGNOSIS — Z8673 Personal history of transient ischemic attack (TIA), and cerebral infarction without residual deficits: Secondary | ICD-10-CM | POA: Insufficient documentation

## 2017-11-01 DIAGNOSIS — R0789 Other chest pain: Secondary | ICD-10-CM | POA: Insufficient documentation

## 2017-11-17 DIAGNOSIS — I610 Nontraumatic intracerebral hemorrhage in hemisphere, subcortical: Secondary | ICD-10-CM | POA: Insufficient documentation

## 2017-11-17 DIAGNOSIS — I639 Cerebral infarction, unspecified: Secondary | ICD-10-CM | POA: Insufficient documentation

## 2018-01-21 ENCOUNTER — Other Ambulatory Visit: Payer: Self-pay | Admitting: Sports Medicine

## 2018-01-21 DIAGNOSIS — M79672 Pain in left foot: Principal | ICD-10-CM

## 2018-01-21 DIAGNOSIS — G629 Polyneuropathy, unspecified: Secondary | ICD-10-CM

## 2018-01-21 DIAGNOSIS — M79671 Pain in right foot: Secondary | ICD-10-CM

## 2018-02-21 ENCOUNTER — Ambulatory Visit: Payer: Medicare HMO | Admitting: Sports Medicine

## 2018-02-28 ENCOUNTER — Ambulatory Visit: Payer: Medicare HMO | Admitting: Sports Medicine

## 2018-04-02 ENCOUNTER — Telehealth: Payer: Self-pay | Admitting: *Deleted

## 2018-04-02 ENCOUNTER — Other Ambulatory Visit: Payer: Self-pay | Admitting: Sports Medicine

## 2018-04-02 DIAGNOSIS — M79672 Pain in left foot: Principal | ICD-10-CM

## 2018-04-02 DIAGNOSIS — G629 Polyneuropathy, unspecified: Secondary | ICD-10-CM

## 2018-04-02 DIAGNOSIS — M79671 Pain in right foot: Secondary | ICD-10-CM

## 2018-04-02 MED ORDER — GABAPENTIN 300 MG PO CAPS: ORAL_CAPSULE | ORAL | 2 refills | Status: DC

## 2018-04-02 NOTE — Progress Notes (Signed)
Sent refill to pharmacy -Dr. Cannon Kettle

## 2018-04-02 NOTE — Telephone Encounter (Signed)
Patient states that he requested a refill of Gabapentin through his pharmacy on Feb 9th pharmacy states that they have not gotten a response.  Patient states he is out.  Please advise.

## 2018-04-02 NOTE — Telephone Encounter (Signed)
Sent refill -Dr. Cannon Kettle

## 2018-04-02 NOTE — Telephone Encounter (Signed)
Notified patient.

## 2018-08-03 ENCOUNTER — Encounter: Payer: Self-pay | Admitting: Sports Medicine

## 2018-08-03 ENCOUNTER — Ambulatory Visit (INDEPENDENT_AMBULATORY_CARE_PROVIDER_SITE_OTHER): Payer: Medicare HMO

## 2018-08-03 ENCOUNTER — Other Ambulatory Visit: Payer: Self-pay | Admitting: Sports Medicine

## 2018-08-03 ENCOUNTER — Other Ambulatory Visit: Payer: Self-pay

## 2018-08-03 ENCOUNTER — Ambulatory Visit (INDEPENDENT_AMBULATORY_CARE_PROVIDER_SITE_OTHER): Payer: Medicare HMO | Admitting: Sports Medicine

## 2018-08-03 VITALS — Temp 97.8°F | Resp 16

## 2018-08-03 DIAGNOSIS — G629 Polyneuropathy, unspecified: Secondary | ICD-10-CM

## 2018-08-03 DIAGNOSIS — M79671 Pain in right foot: Secondary | ICD-10-CM

## 2018-08-03 DIAGNOSIS — M79672 Pain in left foot: Secondary | ICD-10-CM | POA: Diagnosis not present

## 2018-08-03 DIAGNOSIS — L84 Corns and callosities: Secondary | ICD-10-CM

## 2018-08-03 MED ORDER — GABAPENTIN 300 MG PO CAPS: 300.0000 mg | ORAL_CAPSULE | Freq: Three times a day (TID) | ORAL | 3 refills | Status: DC

## 2018-08-03 NOTE — Progress Notes (Signed)
Subjective: Sean [name] is a 78 y.o. male patient who returns to office for follow up evaluation of bilateral foot pain that is sharp, stabbing, tingling pain to all toes on both feet that seems to be worse after work.  Patient reports that he is taking gabapentin once daily at bedtime and reports by the end of the day his feet are so painful that he can barely walk reports that once he gets home props his feet up elevate and takes a shower the pain is much better.  Patient also has questions about his calluses on both feet.  No other issues.  There are no active problems to display for this patient.   Current Outpatient Medications on File Prior to Visit  Medication Sig Dispense Refill  . acetaminophen (TYLENOL) 325 MG tablet Take by mouth.    [facility] [place] PRESERVATIVE FREE injection     . aspirin 81 MG chewable tablet Chew 81 mg by mouth.    . cefUROXime (CEFTIN) 250 MG tablet     . clotrimazole-betamethasone (LOTRISONE) lotion     . COUMADIN 4 MG tablet     . COUMADIN 5 MG tablet   0  . dutasteride (AVODART) 0.5 MG capsule     . Influenza Vac Split High-Dose 0.5 ML SUSY     . metoprolol (LOPRESSOR) 50 MG tablet     . mupirocin ointment (BACTROBAN) 2 %   0  . nitroGLYCERIN (NITROSTAT) 0.4 MG SL tablet     . omeprazole (PRILOSEC) 40 MG capsule Take 40 mg by mouth daily.  0  . pravastatin (PRAVACHOL) 20 MG tablet pravastatin 20 mg tablet  Take 1 tablet every day by oral route.    . simvastatin (ZOCOR) 80 MG tablet     . sulfamethoxazole-trimethoprim (BACTRIM DS,SEPTRA DS) 800-160 MG per tablet Take 1 tablet by mouth 2 (two) times daily. for 10 days  0  . tamsulosin (FLOMAX) 0.4 MG CAPS capsule     . traMADol (ULTRAM) 50 MG tablet     . Vitamin D, Ergocalciferol, (DRISDOL) 50000 UNITS CAPS capsule   0  . ZOSTAVAX 06237 UNT/0.65ML injection      No current facility-administered medications on file prior to visit.     Allergies  Allergen Reactions  . Meperidine Hcl Nausea Only  . Demerol  [Meperidine] Nausea And Vomiting    Objective:  General: Alert and oriented x3 in no acute distress  Dermatology: Minimal reactive keratosis sub met 1 and 5 bilateral. No open lesions bilateral lower extremities, no webspace macerations, no ecchymosis bilateral, all nails x 10 are well manicured.  Vascular: Dorsalis Pedis and Posterior Tibial pedal pulses palpable, Capillary Fill Time 3 seconds,(+) pedal hair growth bilateral, no edema bilateral lower extremities, Temperature gradient within normal limits.  Neurology: Gross sensation intact via light touch bilateral, Protective sensation intact with Semmes Weinstein Monofilament to all pedal sites, vibratory diminished bilateral, No babinski sign present bilateral. (- )Tinels sign bilateral. Subjective sharp pain to all toes.   Musculoskeletal: No reproducible tenderness to palpation to both feet, subjective occassional burning, tingling, sharp pain all toes and now plantar surfaces of both feet,No pain with calf compression bilateral. Range of motion within normal limits except at midtarsal joint and at hammertoes. Strength within normal limits in all groups bilateral.   X-rays bilateral consistent with diffuse arthritis.  Minimal calcaneal spurs.  No other acute findings.  Assessment and Plan: Problem List Items Addressed This Visit    None    Visit  Diagnoses    Neuropathy    -  Primary   Relevant Medications   gabapentin (NEURONTIN) 300 MG capsule   Pain in both feet       Relevant Medications   gabapentin (NEURONTIN) 300 MG capsule   Callus of foot          -Complete examination performed -Extremities reviewed -Re-Discussed treatement options and continued care for arthritis and neuropathy symptoms that is likely worsening -Continue with Gabapentin to 369m to increase to twice daily or 3 times daily as needed -Recommend continue with good supportive shoes and inserts; offered patient power steps patient would like to think  about this option and consider for next visit -Recommend continue soaking with epsom salt as needed for pain relief across arthritic joints  -Recommend continue with vit b supplimentation  -Recommend continue with okeeffe healthy feet and continue with pumice stone and good supportive shoes like before -Patient to return to office in 2 months for medication check or sooner if condition worsens.  TLandis Martins DPM

## 2018-10-03 ENCOUNTER — Ambulatory Visit: Payer: Medicare HMO | Admitting: Sports Medicine

## 2018-10-31 ENCOUNTER — Encounter: Payer: Self-pay | Admitting: Sports Medicine

## 2018-10-31 ENCOUNTER — Ambulatory Visit (INDEPENDENT_AMBULATORY_CARE_PROVIDER_SITE_OTHER): Payer: Medicare HMO | Admitting: Sports Medicine

## 2018-10-31 ENCOUNTER — Other Ambulatory Visit: Payer: Self-pay

## 2018-10-31 DIAGNOSIS — M79672 Pain in left foot: Secondary | ICD-10-CM

## 2018-10-31 DIAGNOSIS — G629 Polyneuropathy, unspecified: Secondary | ICD-10-CM | POA: Diagnosis not present

## 2018-10-31 DIAGNOSIS — L84 Corns and callosities: Secondary | ICD-10-CM | POA: Diagnosis not present

## 2018-10-31 DIAGNOSIS — M79671 Pain in right foot: Secondary | ICD-10-CM

## 2018-10-31 DIAGNOSIS — M7751 Other enthesopathy of right foot: Secondary | ICD-10-CM

## 2018-10-31 DIAGNOSIS — M779 Enthesopathy, unspecified: Secondary | ICD-10-CM

## 2018-10-31 MED ORDER — TRIAMCINOLONE ACETONIDE 10 MG/ML IJ SUSP: 10.0000 mg | Freq: Once | INTRAMUSCULAR | Status: DC

## 2018-10-31 NOTE — Progress Notes (Addendum)
Subjective: Sean [name] is a 78 y.o. male patient who returns to office for follow up evaluation of bilateral foot pain that is sharp, stabbing, tingling pain to all toes on both feet that seems to be better with Gabapentin but pain that is bad under the 5th MTPJ on right.  Patient reports that soaking and pumice stone has helped his callus areas but pain is worse after work.   No other issues.  There are no active problems to display for this patient.   Current Outpatient Medications on File Prior to Visit  Medication Sig Dispense Refill  . acetaminophen (TYLENOL) 325 MG tablet Take by mouth.    [facility] [place] PRESERVATIVE FREE injection     . aspirin 81 MG chewable tablet Chew 81 mg by mouth.    . cefUROXime (CEFTIN) 250 MG tablet     . cetirizine (ZYRTEC) 10 MG tablet TK 1 T PO D PRF COG    . clotrimazole-betamethasone (LOTRISONE) lotion     . COUMADIN 4 MG tablet     . COUMADIN 5 MG tablet   0  . dutasteride (AVODART) 0.5 MG capsule     . enoxaparin (LOVENOX) 80 MG/0.8ML injection INJECT 0.8 MILLILITER AS DIRECTED TWICE DAILY ON SCHEDULE GIVEN BY DOCTORS OFFICE.    [facility] gabapentin (NEURONTIN) 300 MG capsule Take 1 capsule (300 mg total) by mouth 3 (three) times daily. 90 capsule 3  . Influenza Vac Split High-Dose 0.5 ML SUSY     . metoprolol (LOPRESSOR) 50 MG tablet     . mupirocin ointment (BACTROBAN) 2 %   0  . nitroGLYCERIN (NITROSTAT) 0.4 MG SL tablet     . omeprazole (PRILOSEC) 40 MG capsule Take 40 mg by mouth daily.  0  . pravastatin (PRAVACHOL) 20 MG tablet pravastatin 20 mg tablet  Take 1 tablet every day by oral route.    . simvastatin (ZOCOR) 80 MG tablet     . sulfamethoxazole-trimethoprim (BACTRIM DS,SEPTRA DS) 800-160 MG per tablet Take 1 tablet by mouth 2 (two) times daily. for 10 days  0  . tamsulosin (FLOMAX) 0.4 MG CAPS capsule     . traMADol (ULTRAM) 50 MG tablet     . Vitamin D, Ergocalciferol, (DRISDOL) 50000 UNITS CAPS capsule   0  . ZOSTAVAX 71062 UNT/0.65ML injection       No current facility-administered medications on file prior to visit.     Allergies  Allergen Reactions  . Meperidine Hcl Nausea Only  . Demerol [Meperidine] Nausea And Vomiting    Objective:  General: Alert and oriented x3 in no acute distress  Dermatology: Minimal reactive keratosis sub met 1 and 5 bilateral. No open lesions bilateral lower extremities, no webspace macerations, no ecchymosis bilateral, all nails x 10 are well manicured.  Vascular: Dorsalis Pedis and Posterior Tibial pedal pulses palpable, Capillary Fill Time 3 seconds,(+) pedal hair growth bilateral, no edema bilateral lower extremities, Temperature gradient within normal limits.  Neurology: Gross sensation intact via light touch bilateral, Protective sensation intact with Thornell Mule Monofilament to all pedal sites, vibratory diminished bilateral, No babinski sign present bilateral. (- )Tinels sign bilateral. Subjective sharp pain to all toes that has improved with Gabapentin.   Musculoskeletal: + reproducible tenderness to palpation to 5th MTPJ on right, subjective occassional burning, tingling, sharp pain all toes that is better, most pain sub met 5 on right,No pain with calf compression bilateral. Range of motion within normal limits except at midtarsal joint and at hammertoes. Strength within normal  limits in all groups bilateral.   Assessment and Plan: Problem List Items Addressed This Visit    None    Visit Diagnoses    Capsulitis    -  Primary   Relevant Medications   triamcinolone acetonide (KENALOG) 10 MG/ML injection 10 mg (Start on 10/31/2018  8:45 PM)   Neuropathy       Pain in both feet       Relevant Medications   triamcinolone acetonide (KENALOG) 10 MG/ML injection 10 mg (Start on 10/31/2018  8:45 PM)   Callus of foot          -Complete examination performed -Extremities reviewed -Re-Discussed treatement options and continued care for arthritis and neuropathy symptoms with capsulitis at  5th MTPJ on right  -Continue with Gabapentin to 393m to increase to twice daily or 3 times daily as needed for tingling pain -After oral consent and aseptic prep, injected a mixture containing 1 ml of 2%  plain lidocaine, 1 ml 0.5% plain marcaine, 0.5 ml of kenalog 10 and 0.5 ml of dexamethasone phosphate into sub met 5 on right without complication. Post-injection care discussed with patient.  -Recommend continue with good supportive shoes and power steps were not is stock in his size so informed patient that we will let him know when we have a mens 8.5 in stock -Recommend continue soaking with epsom salt as needed for pain relief across arthritic joints  -Recommend continue with vit b supplimentation  -Recommend continue with okeeffe healthy feet and continue with pumice stone like before -Patient to return to office in 1 month for medication check or sooner if condition worsens.  TLandis Martins DPM

## 2018-11-28 ENCOUNTER — Other Ambulatory Visit: Payer: Self-pay

## 2018-11-28 ENCOUNTER — Encounter: Payer: Self-pay | Admitting: Sports Medicine

## 2018-11-28 ENCOUNTER — Ambulatory Visit (INDEPENDENT_AMBULATORY_CARE_PROVIDER_SITE_OTHER): Payer: Medicare HMO | Admitting: Sports Medicine

## 2018-11-28 DIAGNOSIS — M79672 Pain in left foot: Secondary | ICD-10-CM | POA: Diagnosis not present

## 2018-11-28 DIAGNOSIS — M79671 Pain in right foot: Secondary | ICD-10-CM | POA: Diagnosis not present

## 2018-11-28 DIAGNOSIS — G629 Polyneuropathy, unspecified: Secondary | ICD-10-CM

## 2018-11-28 NOTE — Progress Notes (Signed)
Subjective: Sean [name] is a 78 y.o. male patient who returns to office for follow up evaluation of bilateral foot pain that is sharp, stabbing, tingling pain to all toes on both feet, patient reports that since the last time his feet feel better but is still feels like he is walking on rocks no tingling or burning pain but does have the sensation underneath the pads and balls of both feet feel like he is walking on rocks 7-8 out of 10 sharp in nature comes and goes worse after work with some swelling off and on reports that he can only take his gabapentin twice a day because he cannot take it with the tamsulosin and has been using Epson salt taking vitamins icing and elevating without any additional relief. No other issues.  There are no active problems to display for this patient.  Current Outpatient Medications on File Prior to Visit  Medication Sig Dispense Refill  . acetaminophen (TYLENOL) 325 MG tablet Take by mouth.    [facility] [place] PRESERVATIVE FREE injection     . aspirin 81 MG chewable tablet Chew 81 mg by mouth.    . cefUROXime (CEFTIN) 250 MG tablet     . cetirizine (ZYRTEC) 10 MG tablet TK 1 T PO D PRF COG    . clotrimazole-betamethasone (LOTRISONE) lotion     . COUMADIN 4 MG tablet     . COUMADIN 5 MG tablet   0  . dutasteride (AVODART) 0.5 MG capsule     . enoxaparin (LOVENOX) 80 MG/0.8ML injection INJECT 0.8 MILLILITER AS DIRECTED TWICE DAILY ON SCHEDULE GIVEN BY DOCTORS OFFICE.    Marland Kitchen gabapentin (NEURONTIN) 300 MG capsule Take 1 capsule (300 mg total) by mouth 3 (three) times daily. 90 capsule 3  . Influenza Vac Split High-Dose 0.5 ML SUSY     . metoprolol (LOPRESSOR) 50 MG tablet     . mupirocin ointment (BACTROBAN) 2 %   0  . nitroGLYCERIN (NITROSTAT) 0.4 MG SL tablet     . omeprazole (PRILOSEC) 40 MG capsule Take 40 mg by mouth daily.  0  . pravastatin (PRAVACHOL) 20 MG tablet pravastatin 20 mg tablet  Take 1 tablet every day by oral route.    . simvastatin (ZOCOR) 80 MG tablet      . sulfamethoxazole-trimethoprim (BACTRIM DS,SEPTRA DS) 800-160 MG per tablet Take 1 tablet by mouth 2 (two) times daily. for 10 days  0  . tamsulosin (FLOMAX) 0.4 MG CAPS capsule     . traMADol (ULTRAM) 50 MG tablet     . Vitamin D, Ergocalciferol, (DRISDOL) 50000 UNITS CAPS capsule   0  . ZOSTAVAX 93903 UNT/0.65ML injection      Current Facility-Administered Medications on File Prior to Visit  Medication Dose Route Frequency Provider Last Rate Last Dose  . triamcinolone acetonide (KENALOG) 10 MG/ML injection 10 mg  10 mg Other Once Landis Martins, DPM        Allergies  Allergen Reactions  . Meperidine Hcl Nausea Only  . Demerol [Meperidine] Nausea And Vomiting    Objective:  General: Alert and oriented x3 in no acute distress  Dermatology: Minimal reactive keratosis sub met 1 and 5 bilateral. No open lesions bilateral lower extremities, no webspace macerations, no ecchymosis bilateral, all nails x 10 are well manicured.  Vascular: Dorsalis Pedis and Posterior Tibial pedal pulses palpable, Capillary Fill Time 3 seconds,(+) pedal hair growth bilateral, no edema bilateral lower extremities, Temperature gradient within normal limits.  Neurology: Gross sensation intact via  light touch bilateral, Protective sensation intact with Semmes Weinstein Monofilament to all pedal sites, vibratory diminished bilateral, No babinski sign present bilateral. (- )Tinels sign bilateral. Subjective sharp pain to all toes and feeling like he is walking on rocks likely related to neuropathy.   Musculoskeletal: No reproducible tenderness to palpation to both feet, subjective occassional burning, tingling, sharp pain all toes and now plantar surfaces of both feet as above,No pain with calf compression bilateral. Range of motion within normal limits except at midtarsal joint and at hammertoes. Strength within normal limits in all groups bilateral.   Assessment and Plan: Problem List Items Addressed This Visit     None    Visit Diagnoses    Neuropathy    -  Primary   Pain in both feet          -Complete examination performed -Re-Discussed treatement options and continued care for arthritis and neuropathy symptoms -Continue with Gabapentin 300 mg at lunch and 600 mg at bedtime -Advised patient to get new shoes and next visit if he is still in pain will offer a new set of power step insoles if he had his size available 8-1/2 or make some recommendations on over-the-counter insoles that he can try for his feet -Recommend continue with vit b supplementation; to get vitamin B complex or alpha lipoic acid -Recommend continue with okeeffe healthy feet and continue with pumice stone and good supportive shoes like before to decrease any reactive callus buildup -Patient to return to office in 6 to 8 weeks for medication check or sooner if condition worsens.  Landis Martins, DPM

## 2018-11-28 NOTE — Patient Instructions (Signed)
Alpha lipoic acid or Vit B complex

## 2019-01-04 ENCOUNTER — Other Ambulatory Visit: Payer: Self-pay

## 2019-01-04 ENCOUNTER — Ambulatory Visit (INDEPENDENT_AMBULATORY_CARE_PROVIDER_SITE_OTHER): Payer: Medicare HMO | Admitting: Sports Medicine

## 2019-01-04 ENCOUNTER — Encounter: Payer: Self-pay | Admitting: Sports Medicine

## 2019-01-04 DIAGNOSIS — M79672 Pain in left foot: Secondary | ICD-10-CM | POA: Diagnosis not present

## 2019-01-04 DIAGNOSIS — L84 Corns and callosities: Secondary | ICD-10-CM

## 2019-01-04 DIAGNOSIS — G629 Polyneuropathy, unspecified: Secondary | ICD-10-CM | POA: Diagnosis not present

## 2019-01-04 DIAGNOSIS — M79671 Pain in right foot: Secondary | ICD-10-CM

## 2019-01-04 NOTE — Progress Notes (Signed)
Subjective: Sean [name] is a 78 y.o. male patient who returns to office for follow up evaluation of bilateral foot pain that is sharp, stabbing, tingling pain to all toes on both feet, patient reports that still feels like he is walking on rocks 9 out of 10 sharp in nature comes and goes worse after work reports that he has changed shoes and still nothing has helped.  No other pedal complaints noted.  There are no active problems to display for this patient.  Current Outpatient Medications on File Prior to Visit  Medication Sig Dispense Refill  . acetaminophen (TYLENOL) 325 MG tablet Take by mouth.    [facility] [place] PRESERVATIVE FREE injection     . aspirin 81 MG chewable tablet Chew 81 mg by mouth.    . cefUROXime (CEFTIN) 250 MG tablet     . cetirizine (ZYRTEC) 10 MG tablet TK 1 T PO D PRF COG    . clotrimazole-betamethasone (LOTRISONE) lotion     . COUMADIN 4 MG tablet     . COUMADIN 5 MG tablet   0  . dutasteride (AVODART) 0.5 MG capsule     . enoxaparin (LOVENOX) 80 MG/0.8ML injection INJECT 0.8 MILLILITER AS DIRECTED TWICE DAILY ON SCHEDULE GIVEN BY DOCTORS OFFICE.    Marland Kitchen gabapentin (NEURONTIN) 300 MG capsule Take 1 capsule (300 mg total) by mouth 3 (three) times daily. 90 capsule 3  . Influenza Vac Split High-Dose 0.5 ML SUSY     . metoprolol (LOPRESSOR) 50 MG tablet     . mupirocin ointment (BACTROBAN) 2 %   0  . nitroGLYCERIN (NITROSTAT) 0.4 MG SL tablet     . omeprazole (PRILOSEC) 40 MG capsule Take 40 mg by mouth daily.  0  . pravastatin (PRAVACHOL) 20 MG tablet pravastatin 20 mg tablet  Take 1 tablet every day by oral route.    . simvastatin (ZOCOR) 80 MG tablet     . sulfamethoxazole-trimethoprim (BACTRIM DS,SEPTRA DS) 800-160 MG per tablet Take 1 tablet by mouth 2 (two) times daily. for 10 days  0  . tamsulosin (FLOMAX) 0.4 MG CAPS capsule     . traMADol (ULTRAM) 50 MG tablet     . Vitamin D, Ergocalciferol, (DRISDOL) 50000 UNITS CAPS capsule   0  . ZOSTAVAX 64332 UNT/0.65ML  injection      Current Facility-Administered Medications on File Prior to Visit  Medication Dose Route Frequency Provider Last Rate Last Dose  . triamcinolone acetonide (KENALOG) 10 MG/ML injection 10 mg  10 mg Other Once Landis Martins, DPM        Allergies  Allergen Reactions  . Meperidine Hcl Nausea Only  . Demerol [Meperidine] Nausea And Vomiting    Objective:  General: Alert and oriented x3 in no acute distress  Dermatology: Minimal reactive keratosis sub met 1 and 5 bilateral. No open lesions bilateral lower extremities, no webspace macerations, no ecchymosis bilateral, all nails x 10 are well manicured.  Vascular: Dorsalis Pedis and Posterior Tibial pedal pulses palpable, Capillary Fill Time 3 seconds,(+) pedal hair growth bilateral, no edema bilateral lower extremities, Temperature gradient within normal limits.  Neurology: Gross sensation intact via light touch bilateral, Protective sensation intact with Semmes Weinstein Monofilament to all pedal sites, vibratory diminished bilateral, No babinski sign present bilateral. (- )Tinels sign bilateral. Subjective sharp pain to all toes and feeling like he is walking on rocks likely related to neuropathy like before.  Musculoskeletal: No reproducible tenderness to palpation to both feet, subjective occassional burning, tingling, sharp  pain all toes and plantar forefoot.  No pain with calf compression bilateral. Range of motion within normal limits except at midtarsal joint and at hammertoes. Strength within normal limits in all groups bilateral.   Assessment and Plan: Problem List Items Addressed This Visit    None    Visit Diagnoses    Neuropathy    -  Primary   Pain in both feet       Callus of foot          -Complete examination performed -Re-Discussed treatement options and continued care for arthritis and neuropathy symptoms -Continue with Gabapentin 300 mg at lunch and 600 mg at bedtime will not increase dose until after  patient is seen by neurologist for additional recommendations and advised patient to also discuss his pain and symptoms with the hematologist to see if anything else could be contributing to his symptoms -Recommend continue with vit B or alpha lipoic acid supplementation -Recommend continue with okeeffe healthy feet and continue with pumice stone and good supportive shoes like before to decrease any reactive callus buildup like before -Patient to return to office after neurology or sooner if condition worsens.  Landis Martins, DPM

## 2019-01-07 ENCOUNTER — Telehealth: Payer: Self-pay | Admitting: *Deleted

## 2019-01-07 DIAGNOSIS — L84 Corns and callosities: Secondary | ICD-10-CM

## 2019-01-07 DIAGNOSIS — M79671 Pain in right foot: Secondary | ICD-10-CM

## 2019-01-07 DIAGNOSIS — G629 Polyneuropathy, unspecified: Secondary | ICD-10-CM

## 2019-01-07 DIAGNOSIS — M79672 Pain in left foot: Secondary | ICD-10-CM

## 2019-01-07 DIAGNOSIS — M779 Enthesopathy, unspecified: Secondary | ICD-10-CM

## 2019-01-07 NOTE — Telephone Encounter (Signed)
Referral to [facility] - Dr. Everette Rank.

## 2019-01-07 NOTE — Telephone Encounter (Signed)
-----   Message from Sean [name], [place] sent at 01/04/2019 10:37 AM EST ----- Regarding: Neurology referal for Neuropathy not improved with Gabapentin Patient wants to see Dr. Everette Rank in [place]

## 2019-01-14 DIAGNOSIS — D61818 Other pancytopenia: Secondary | ICD-10-CM

## 2019-01-28 DIAGNOSIS — D61818 Other pancytopenia: Secondary | ICD-10-CM | POA: Diagnosis not present

## 2019-04-26 ENCOUNTER — Other Ambulatory Visit: Payer: Self-pay

## 2019-04-26 ENCOUNTER — Ambulatory Visit: Payer: Medicare HMO | Admitting: Sports Medicine

## 2019-04-26 ENCOUNTER — Encounter: Payer: Self-pay | Admitting: Sports Medicine

## 2019-04-26 DIAGNOSIS — M79672 Pain in left foot: Secondary | ICD-10-CM

## 2019-04-26 DIAGNOSIS — G629 Polyneuropathy, unspecified: Secondary | ICD-10-CM | POA: Diagnosis not present

## 2019-04-26 DIAGNOSIS — L84 Corns and callosities: Secondary | ICD-10-CM

## 2019-04-26 DIAGNOSIS — M79671 Pain in right foot: Secondary | ICD-10-CM

## 2019-04-26 MED ORDER — GABAPENTIN 300 MG PO CAPS: 300.0000 mg | ORAL_CAPSULE | Freq: Three times a day (TID) | ORAL | 3 refills | Status: DC

## 2019-04-26 NOTE — Progress Notes (Signed)
Subjective: Sean [name] is a 79 y.o. male patient who returns to office for follow up evaluation of bilateral foot pain that is sharp, stabbing, tingling pain to all toes on both feet, patient reports that still feels like the more he walks the worse it feels with increased tingling numbness and burning feels like he walking on rocks with the calluses giving him the same usual problems.  No other pedal complaints noted.  There are no problems to display for this patient.  Current Outpatient Medications on File Prior to Visit  Medication Sig Dispense Refill  . acetaminophen (TYLENOL) 325 MG tablet Take by mouth.    [facility] [place] PRESERVATIVE FREE injection     . aspirin 81 MG chewable tablet Chew 81 mg by mouth.    . cefUROXime (CEFTIN) 250 MG tablet     . cetirizine (ZYRTEC) 10 MG tablet TK 1 T PO D PRF COG    . clotrimazole-betamethasone (LOTRISONE) lotion     . COUMADIN 4 MG tablet     . COUMADIN 5 MG tablet   0  . dutasteride (AVODART) 0.5 MG capsule     . enoxaparin (LOVENOX) 80 MG/0.8ML injection INJECT 0.8 MILLILITER AS DIRECTED TWICE DAILY ON SCHEDULE GIVEN BY DOCTORS OFFICE.    Marland Kitchen gabapentin (NEURONTIN) 300 MG capsule Take 1 capsule (300 mg total) by mouth 3 (three) times daily. 90 capsule 3  . Influenza Vac Split High-Dose 0.5 ML SUSY     . Influenza Vac Typ A&B Surf Ant 0.5 ML SUSY Fluad 2017-18 86yrup(PF)45 mcg(15 mcgx3)/0.5 mL intramuscular syringe    . Magnesium Oxide 400 MG CAPS magnesium 400 mg (as magnesium oxide) capsule  take 1 tab 3x  a week do not take with gabapentin at the same time    . metoprolol (LOPRESSOR) 50 MG tablet     . mupirocin ointment (BACTROBAN) 2 %   0  . nitroGLYCERIN (NITROSTAT) 0.4 MG SL tablet     . omeprazole (PRILOSEC) 40 MG capsule Take 40 mg by mouth daily.  0  . pravastatin (PRAVACHOL) 20 MG tablet pravastatin 20 mg tablet  Take 1 tablet every day by oral route.    . simvastatin (ZOCOR) 80 MG tablet     . sulfamethoxazole-trimethoprim  (BACTRIM DS,SEPTRA DS) 800-160 MG per tablet Take 1 tablet by mouth 2 (two) times daily. for 10 days  0  . tamsulosin (FLOMAX) 0.4 MG CAPS capsule     . traMADol (ULTRAM) 50 MG tablet     . triamcinolone ointment (KENALOG) 0.5 % triamcinolone acetonide 0.5 % topical ointment    . Vitamin D, Ergocalciferol, (DRISDOL) 50000 UNITS CAPS capsule   0  . ZOSTAVAX 110272UNT/0.65ML injection      Current Facility-Administered Medications on File Prior to Visit  Medication Dose Route Frequency Provider Last Rate Last Admin  . triamcinolone acetonide (KENALOG) 10 MG/ML injection 10 mg  10 mg Other Once SLandis Martins DPM        Allergies  Allergen Reactions  . Meperidine Hcl Nausea Only  . Demerol [Meperidine] Nausea And Vomiting    Objective:  General: Alert and oriented x3 in no acute distress  Dermatology: Minimal reactive keratosis sub met 1 and 5 bilateral. No open lesions bilateral lower extremities, no webspace macerations, no ecchymosis bilateral, all nails x 10 are well manicured.  Vascular: Dorsalis Pedis and Posterior Tibial pedal pulses palpable, Capillary Fill Time 3 seconds,(+) pedal hair growth bilateral, no edema bilateral lower extremities, Temperature gradient  within normal limits.  Neurology: Gross sensation intact via light touch bilateral, Protective sensation intact with Semmes Weinstein Monofilament to all pedal sites, vibratory diminished bilateral, No babinski sign present bilateral. (- )Tinels sign bilateral. Subjective sharp pain to all toes and feeling like he is walking on rocks likely related to neuropathy like before worse at the end of his work shift.  Musculoskeletal: No reproducible tenderness to palpation to both feet, subjective occassional burning, tingling, sharp pain all toes and plantar forefoot and pain after walking and standing especially at work.  No pain with calf compression bilateral. Range of motion within normal limits except at midtarsal joint and  at hammertoes. Strength within normal limits in all groups bilateral.   Assessment and Plan: Problem List Items Addressed This Visit    None    Visit Diagnoses    Neuropathy    -  Primary   Pain in both feet       Callus of foot          -Complete examination performed -Re-Discussed treatement options and continued care for arthritis and neuropathy symptoms -Continue with Gabapentin 300 mg at lunch and 600 mg at bedtime -Continue with neurology recommendations and vitamin B supplementation -At no charge mechanically debrided callus areas plantar forefoot bilateral using a sterile chisel blade -Recommend continue with okeeffe healthy feet and continue with pumice stone and good supportive shoes like before to decrease any reactive callus buildup like before -Work note provided to patient to limit walking and standing during shift to 400 feet due to foot pain -Patient to return to office as needed or sooner if condition worsens.  Landis Martins, DPM

## 2019-04-30 ENCOUNTER — Telehealth: Payer: Self-pay | Admitting: Urology

## 2019-04-30 NOTE — Telephone Encounter (Signed)
6 months

## 2019-04-30 NOTE — Telephone Encounter (Signed)
THank you!

## 2019-04-30 NOTE — Telephone Encounter (Signed)
Pt called and his boss needs to know how long the restrictions will be in place for him only able to walk so far? Please advise. Thanks

## 2019-10-02 ENCOUNTER — Encounter: Payer: Self-pay | Admitting: Sports Medicine

## 2019-10-02 ENCOUNTER — Ambulatory Visit: Payer: Medicare HMO | Admitting: Sports Medicine

## 2019-10-02 ENCOUNTER — Other Ambulatory Visit: Payer: Self-pay

## 2019-10-02 DIAGNOSIS — L84 Corns and callosities: Secondary | ICD-10-CM

## 2019-10-02 DIAGNOSIS — M79672 Pain in left foot: Secondary | ICD-10-CM

## 2019-10-02 DIAGNOSIS — M79671 Pain in right foot: Secondary | ICD-10-CM | POA: Diagnosis not present

## 2019-10-02 DIAGNOSIS — G629 Polyneuropathy, unspecified: Secondary | ICD-10-CM | POA: Diagnosis not present

## 2019-10-02 NOTE — Progress Notes (Signed)
Subjective: Sean [name] is a 79 y.o. male patient who returns to office for follow up evaluation of bilateral foot pain that is sharp, stabbing, tingling pain to all toes on both feet like before still feels like he is walking on rocks.  Reports that constantly his feet hurt at work and states that sometimes even when he does yard work he has pain after about 2 hours.  Patient reports Epson salt soaks helps and the gabapentin seems to help some.  Patient is also here for update of his work note and restrictions that have been in place since last visit.  Patient denies any other pedal complaints at this time.  Reports that he will be going to hematologist as well for further evaluation.  There are no problems to display for this patient.  Current Outpatient Medications on File Prior to Visit  Medication Sig Dispense Refill  . acetaminophen (TYLENOL) 325 MG tablet Take by mouth.    [facility] [place] PRESERVATIVE FREE injection     . aspirin 81 MG chewable tablet Chew 81 mg by mouth.    . cefUROXime (CEFTIN) 250 MG tablet     . cetirizine (ZYRTEC) 10 MG tablet TK 1 T PO D PRF COG    . clotrimazole-betamethasone (LOTRISONE) lotion     . COUMADIN 4 MG tablet     . COUMADIN 5 MG tablet   0  . dutasteride (AVODART) 0.5 MG capsule     . enoxaparin (LOVENOX) 80 MG/0.8ML injection INJECT 0.8 MILLILITER AS DIRECTED TWICE DAILY ON SCHEDULE GIVEN BY DOCTORS OFFICE.    Marland Kitchen gabapentin (NEURONTIN) 300 MG capsule Take 1 capsule (300 mg total) by mouth 3 (three) times daily. 1 capsule at lunchtime and 2 at bedtime 90 capsule 3  . Influenza Vac Split High-Dose 0.5 ML SUSY     . Influenza Vac Typ A&B Surf Ant 0.5 ML SUSY Fluad 2017-18 30yrup(PF)45 mcg(15 mcgx3)/0.5 mL intramuscular syringe    . Magnesium Oxide 400 MG CAPS magnesium 400 mg (as magnesium oxide) capsule  take 1 tab 3x  a week do not take with gabapentin at the same time    . metoprolol (LOPRESSOR) 50 MG tablet     . mupirocin ointment (BACTROBAN) 2 %   0   . nitroGLYCERIN (NITROSTAT) 0.4 MG SL tablet     . omeprazole (PRILOSEC) 40 MG capsule Take 40 mg by mouth daily.  0  . pravastatin (PRAVACHOL) 20 MG tablet pravastatin 20 mg tablet  Take 1 tablet every day by oral route.    . simvastatin (ZOCOR) 80 MG tablet     . sulfamethoxazole-trimethoprim (BACTRIM DS,SEPTRA DS) 800-160 MG per tablet Take 1 tablet by mouth 2 (two) times daily. for 10 days  0  . tamsulosin (FLOMAX) 0.4 MG CAPS capsule     . traMADol (ULTRAM) 50 MG tablet     . triamcinolone ointment (KENALOG) 0.5 % triamcinolone acetonide 0.5 % topical ointment    . Vitamin D, Ergocalciferol, (DRISDOL) 50000 UNITS CAPS capsule   0  . ZOSTAVAX 186578UNT/0.65ML injection      Current Facility-Administered Medications on File Prior to Visit  Medication Dose Route Frequency Provider Last Rate Last Admin  . triamcinolone acetonide (KENALOG) 10 MG/ML injection 10 mg  10 mg Other Once SLandis Martins DPM        Allergies  Allergen Reactions  . Meperidine Hcl Nausea Only  . Demerol [Meperidine] Nausea And Vomiting    Objective:  General: Alert and  oriented x3 in no acute distress  Dermatology: Minimal reactive keratosis sub met 1 and 5 bilateral. No open lesions bilateral lower extremities, no webspace macerations, no ecchymosis bilateral, all nails x 10 are well manicured.  Vascular: Dorsalis Pedis and Posterior Tibial pedal pulses palpable, Capillary Fill Time 3 seconds,(+) pedal hair growth bilateral, no edema bilateral lower extremities, Temperature gradient within normal limits.  Neurology: Gross sensation intact via light touch bilateral, Protective sensation intact with Semmes Weinstein Monofilament to all pedal sites, vibratory diminished bilateral, No babinski sign present bilateral. (- )Tinels sign bilateral. Subjective sharp pain to all toes and feeling like he is walking on rocks likely related to neuropathy like before worse at the end of his work shift.  Musculoskeletal:  No reproducible tenderness to palpation to both feet, subjective occassional burning, tingling, sharp pain all toes and plantar forefoot and pain after walking and standing especially at work like previous and also reports pain sometimes at home when doing yard work.  No pain with calf compression bilateral. Range of motion within normal limits except at midtarsal joint and at hammertoes. Strength within normal limits in all groups bilateral.   Assessment and Plan: Problem List Items Addressed This Visit    None    Visit Diagnoses    Neuropathy    -  Primary   Pain in both feet       Callus of foot          -Complete examination performed -Re-Discussed treatement options and continued care for arthritis and neuropathy symptoms -Continue with Gabapentin with adjustments as needed by neurologist -Continue with neurology recommendations and vitamin B supplementation -At no charge mechanically debrided callus areas plantar forefoot bilateral using a sterile chisel blade -Recommend continue with okeeffe healthy feet and continue with pumice stone and good supportive shoes and insoles like before to decrease any reactive callus buildup like before -Work note provided to patient to limit walking and standing during shift to 400 feet due to foot pain good for the next 6 months -Patient to return to office as needed or sooner if condition worsens.  Landis Martins, DPM

## 2019-10-17 NOTE — Addendum Note (Signed)
Addended by: Cranford Mon R on: 10/17/2019 03:20 PM   Modules accepted: Orders

## 2019-11-13 ENCOUNTER — Other Ambulatory Visit: Payer: Self-pay | Admitting: Sports Medicine

## 2019-11-13 ENCOUNTER — Telehealth: Payer: Self-pay | Admitting: Sports Medicine

## 2019-11-13 DIAGNOSIS — G629 Polyneuropathy, unspecified: Secondary | ICD-10-CM

## 2019-11-13 DIAGNOSIS — M79672 Pain in left foot: Secondary | ICD-10-CM

## 2019-11-13 MED ORDER — GABAPENTIN 300 MG PO CAPS: 300.0000 mg | ORAL_CAPSULE | Freq: Three times a day (TID) | ORAL | 3 refills | Status: DC

## 2019-11-13 NOTE — Telephone Encounter (Signed)
Pt req refill for Gabapentin 300 mg [facility] [address]

## 2019-11-13 NOTE — Progress Notes (Signed)
Refilled Gabapentin

## 2019-11-13 NOTE — Telephone Encounter (Signed)
Refill sent.

## 2019-12-12 DIAGNOSIS — D61818 Other pancytopenia: Secondary | ICD-10-CM | POA: Diagnosis not present

## 2020-01-31 ENCOUNTER — Encounter: Payer: Self-pay | Admitting: Sports Medicine

## 2020-01-31 ENCOUNTER — Other Ambulatory Visit: Payer: Self-pay

## 2020-01-31 ENCOUNTER — Ambulatory Visit: Payer: Medicare HMO | Admitting: Sports Medicine

## 2020-01-31 DIAGNOSIS — M79672 Pain in left foot: Secondary | ICD-10-CM

## 2020-01-31 DIAGNOSIS — L603 Nail dystrophy: Secondary | ICD-10-CM

## 2020-01-31 DIAGNOSIS — L84 Corns and callosities: Secondary | ICD-10-CM | POA: Diagnosis not present

## 2020-01-31 DIAGNOSIS — M79671 Pain in right foot: Secondary | ICD-10-CM | POA: Diagnosis not present

## 2020-01-31 DIAGNOSIS — G629 Polyneuropathy, unspecified: Secondary | ICD-10-CM

## 2020-01-31 NOTE — Patient Instructions (Signed)

## 2020-01-31 NOTE — Progress Notes (Signed)
Subjective: Sean [name] is a 79 y.o. male patient who returns to office for follow up evaluation of bilateral foot pain reports that he still has sharp shooting tingling pain to all toes gabapentin helps but has not helped completely reports that he also still has trouble with pain around his callus areas difficult to reach to keep them under control.  Reports that he has his neurologist further working him up will be getting an MRI of the back.  Patient denies any other pedal complaints at this time.  Reports that he he is considering getting new shoes and has told him about [place] shoes.  No other issues noted. Patient Active Problem List   Diagnosis Date Noted  . 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 Cerebral infarction ([place]) 11/17/2017  . Chest discomfort 11/01/2017  . H/O: CVA (cerebrovascular accident) 11/01/2017  . CAD in native artery 12/23/2016  . Current use of long term anticoagulation 12/23/2016  . H/O heart artery stent 12/23/2016  . Malaise and fatigue 12/23/2016  . PAF (paroxysmal atrial fibrillation) ([place]) 12/23/2016  . 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 acetaminophen (TYLENOL) 325 MG tablet Take by mouth.    [facility] [place] PRESERVATIVE FREE injection     . aspirin 81 MG chewable tablet Chew 81 mg by mouth.    . cetirizine (ZYRTEC) 10 MG tablet TK 1 T PO D PRF COG    . Cholecalciferol 125 MCG (5000 UT) TABS cholecalciferol (vitamin D3) 125 mcg (5,000 unit) tablet   5000 units by oral route.    . dutasteride (AVODART) 0.5 MG capsule     . enoxaparin (LOVENOX) 80 MG/0.8ML injection INJECT 0.8 MILLILITER AS DIRECTED TWICE DAILY ON SCHEDULE GIVEN BY DOCTORS OFFICE.    Marland Kitchen gabapentin (NEURONTIN) 300 MG capsule Take 1 capsule (300 mg total) by mouth 3 (three) times daily. 1 capsule at lunchtime and 2 at bedtime 90 capsule 3  . Influenza Vac Split High-Dose 0.5 ML SUSY     .  Influenza Vac Typ A&B Surf Ant 0.5 ML SUSY Fluad 2017-18 19yr up(PF)45 mcg(15 mcgx3)/0.5 mL intramuscular syringe    . Magnesium Oxide 400 MG CAPS magnesium 400 mg (as magnesium oxide) capsule  take 1 tab 3x  a week do not take with gabapentin at the same time    . metoprolol (LOPRESSOR) 50 MG tablet     . nitroGLYCERIN (NITROSTAT) 0.4 MG SL tablet     . omeprazole (PRILOSEC) 40 MG capsule Take 40 mg by mouth daily.  0  . pravastatin (PRAVACHOL) 20 MG tablet pravastatin 20 mg tablet  Take 1 tablet every day by oral route.    . tamsulosin (FLOMAX) 0.4 MG CAPS capsule Take by mouth.    . triamcinolone ointment (KENALOG) 0.5 % triamcinolone acetonide 0.5 % topical ointment    . vitamin B-12 (CYANOCOBALAMIN) 500 MCG tablet Take by mouth.    . Vitamin D, Ergocalciferol, (DRISDOL) 50000 UNITS CAPS capsule   0   Current Facility-Administered Medications on File Prior to Visit  Medication Dose Route Frequency Provider Last Rate Last Admin  . triamcinolone acetonide (KENALOG) 10 MG/ML injection 10 mg  10 mg Other Once Landis Martins, DPM        Allergies  Allergen Reactions  . Meperidine Hcl Nausea Only  . Demerol [Meperidine] Nausea And Vomiting    Objective:  General: Alert and oriented x3 in no acute  distress  Dermatology: Minimal reactive keratosis sub met 1 and 5 bilateral. No open lesions bilateral lower extremities, no webspace macerations, no ecchymosis bilateral, all nails x 10 are are mildly elongated  Vascular: Dorsalis Pedis and Posterior Tibial pedal pulses palpable, Capillary Fill Time 3 seconds,(+) pedal hair growth bilateral, no edema bilateral lower extremities, Temperature gradient within normal limits.  Neurology: Gross sensation intact via light touch bilateral, Protective sensation intact with Semmes Weinstein Monofilament to all pedal sites, vibratory diminished bilateral, No babinski sign present bilateral. (- )Tinels sign bilateral. Subjective sharp pain to all toes and  feeling like he is walking on rocks likely related to neuropathy like before worse at the end of his work shift.  Musculoskeletal: No reproducible tenderness to palpation to both feet, subjective occassional burning, tingling, sharp pain all toes and plantar forefoot and pain after walking and standing especially at work like previous and also reports pain sometimes at home when doing yard work.  No pain with calf compression bilateral. Range of motion within normal limits except at midtarsal joint and at hammertoes. Strength within normal limits in all groups bilateral.   Assessment and Plan: Problem List Items Addressed This Visit   None   Visit Diagnoses    Callus of foot    -  Primary   Neuropathy       Pain in both feet       Nail dystrophy          -Complete examination performed -Re-Discussed treatement options and continued care for arthritis and neuropathy symptoms and callus and nails -Continue with Gabapentin with adjustments as directed by neurologist -Continue with neurology recommendations and vitamin B supplementation -Advised patient may be a candidate to have his back looked at since he has no history of diabetes but has significant neuropathy symptoms; patient reports that the neurologist is working up with back and he is going for MRI -At no charge mechanically debrided nails using a sterile nail nipper and callus areas plantar forefoot bilateral using a sterile chisel blade -Recommend continue with okeeffe healthy feet and continue with pumice stone and good supportive shoes and insoles like before to decrease any reactive callus buildup like before recommend patient to go to [employer] or 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  Landis Martins, DPM

## 2020-05-05 ENCOUNTER — Other Ambulatory Visit: Payer: Self-pay

## 2020-05-05 ENCOUNTER — Ambulatory Visit: Payer: Medicare HMO | Admitting: Sports Medicine

## 2020-05-05 ENCOUNTER — Encounter: Payer: Self-pay | Admitting: Sports Medicine

## 2020-05-05 DIAGNOSIS — M79671 Pain in right foot: Secondary | ICD-10-CM

## 2020-05-05 DIAGNOSIS — G629 Polyneuropathy, unspecified: Secondary | ICD-10-CM

## 2020-05-05 DIAGNOSIS — M778 Other enthesopathies, not elsewhere classified: Secondary | ICD-10-CM | POA: Diagnosis not present

## 2020-05-05 DIAGNOSIS — M79672 Pain in left foot: Secondary | ICD-10-CM

## 2020-05-05 DIAGNOSIS — M216X9 Other acquired deformities of unspecified foot: Secondary | ICD-10-CM

## 2020-05-05 DIAGNOSIS — L84 Corns and callosities: Secondary | ICD-10-CM

## 2020-05-05 DIAGNOSIS — L603 Nail dystrophy: Secondary | ICD-10-CM

## 2020-05-05 MED ORDER — TRIAMCINOLONE ACETONIDE 10 MG/ML IJ SUSP
10.0000 mg | Freq: Once | INTRAMUSCULAR | Status: AC
  Administered 2020-05-05: 10 mg

## 2020-05-05 NOTE — Progress Notes (Signed)
Subjective: Sean [name] is a 80 y.o. male patient who returns to office for follow up evaluation of bilateral foot pain reports that he is having a lot of pain over the callus at the fifth toe joint on the right.  Reports that he got new shoes that seems to help but lately over the last few days the pain has been really bad at the ball of the right foot along the callus of the baby toe joint.  Patient also reports some sharp shooting pain and some changes that he has noted to the foot on the left feels like his foot is turning in more when he has these sharp pains.  Patient also has a difficult time recalling if his neurologist did further work-up and states that the gabapentin makes him sleepy so he has been having difficult time taking it.   Patient Active Problem List   Diagnosis Date Noted  . 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 Cerebral infarction ([place]) 11/17/2017  . Chest discomfort 11/01/2017  . H/O: CVA (cerebrovascular accident) 11/01/2017  . CAD in native artery 12/23/2016  . Current use of long term anticoagulation 12/23/2016  . H/O heart artery stent 12/23/2016  . Malaise and fatigue 12/23/2016  . PAF (paroxysmal atrial fibrillation) ([place]) 12/23/2016  . 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 acetaminophen (TYLENOL) 325 MG tablet Take by mouth.    [facility] [place] PRESERVATIVE FREE injection     . amoxicillin (AMOXIL) 500 MG capsule amoxicillin 500 mg capsule    . aspirin 81 MG chewable tablet Chew 81 mg by mouth.    . cetirizine (ZYRTEC) 10 MG tablet TK 1 T PO D PRF COG    . Cholecalciferol 125 MCG (5000 UT) TABS cholecalciferol (vitamin D3) 125 mcg (5,000 unit) tablet   5000 units by oral route.    . clotrimazole-betamethasone (LOTRISONE) cream clotrimazole-betamethasone 1 %-0.05 % topical cream  APPLY TOPICALLY TO THE AFFECTED AND SURROUNDING AREAS TWICE DAILY IN THE MORNING  AND IN THE EVENING AS NEEDED    . dutasteride (AVODART) 0.5 MG capsule     . enoxaparin (LOVENOX) 80 MG/0.8ML injection INJECT 0.8 MILLILITER AS DIRECTED TWICE DAILY ON SCHEDULE GIVEN BY DOCTORS OFFICE.    Marland Kitchen gabapentin (NEURONTIN) 300 MG capsule Take 1 capsule (300 mg total) by mouth 3 (three) times daily. 1 capsule at lunchtime and 2 at bedtime 90 capsule 3  . Influenza Vac Split High-Dose 0.5 ML SUSY     . Influenza Vac Typ A&B Surf Ant 0.5 ML SUSY Fluad 2017-18 76yrup(PF)45 mcg(15 mcgx3)/0.5 mL intramuscular syringe    . Magnesium Oxide 400 MG CAPS magnesium 400 mg (as magnesium oxide) capsule  take 1 tab 3x  a week do not take with gabapentin at the same time    . metoprolol (LOPRESSOR) 50 MG tablet     . nitroGLYCERIN (NITROSTAT) 0.4 MG SL tablet     . omeprazole (PRILOSEC) 40 MG capsule Take 40 mg by mouth daily.  0  . pravastatin (PRAVACHOL) 20 MG tablet pravastatin 20 mg tablet  Take 1 tablet every day by oral route.    . tamsulosin (FLOMAX) 0.4 MG CAPS capsule Take by mouth.    . triamcinolone ointment (KENALOG) 0.5 % triamcinolone acetonide 0.5 % topical ointment    . vitamin B-12 (CYANOCOBALAMIN) 500 MCG tablet Take by mouth.    . Vitamin D, Ergocalciferol, (DRISDOL)  50000 UNITS CAPS capsule   0   Current Facility-Administered Medications on File Prior to Visit  Medication Dose Route Frequency Provider Last Rate Last Admin  . triamcinolone acetonide (KENALOG) 10 MG/ML injection 10 mg  10 mg Other Once Landis Martins, DPM        Allergies  Allergen Reactions  . Meperidine Hcl Nausea Only  . Demerol [Meperidine] Nausea And Vomiting    Objective:  General: Alert and oriented x3 in no acute distress  Dermatology: + reactive keratosis sub met 1 and 5 bilateral. No open lesions bilateral lower extremities, no webspace macerations, no ecchymosis bilateral, all nails x 10 are are mildly elongated  Vascular: Dorsalis Pedis and Posterior Tibial pedal pulses palpable, Capillary  Fill Time 3 seconds,(+) pedal hair growth bilateral, no edema bilateral lower extremities, Temperature gradient within normal limits.  Neurology: Johney Maine sensation intact via light touch bilateral, subjective neuropathy pains of both feet like before with most recent pain increases slowly on the left lower leg and to the ball of the right foot at the fifth MPJ.  Musculoskeletal: Cavovarus foot type.  There is pain to palpation at the fifth MPJ on the right consistent with capsulitis at the lesser metatarsophalangeal joint along with callus.  Range of motion within normal limits except at midtarsal joint and at hammertoes. Strength within normal limits in all groups bilateral.   Assessment and Plan: Problem List Items Addressed This Visit   None   Visit Diagnoses    Capsulitis of foot, right    -  Primary   Callus of foot       Neuropathy       Nail dystrophy       Pain in both feet       Cavus deformity of foot, acquired          -Complete examination performed -Re-Discussed treatement options for neuropathy symptoms and callus with inflammation at the fifth MPJ on the right and nails -Continue with Gabapentin with adjustments as directed by neurologist may benefit from switching to Cymbalta as per neurologist recommendations -Continue with neurology recommendations and vitamin B supplementation like previous and advised patient to follow-up with his doctor for further recommendations and may benefit from further work-up of his back since his symptoms are continuing to progress and since he has documented spinal stenosis and arthritis -After oral consent and aseptic prep, injected a mixture containing 1 ml of 2%  plain lidocaine, 1 ml 0.5% plain marcaine, 0.5 ml of kenalog 10 and 0.5 ml of dexamethasone phosphate into right fifth metatarsophalangeal joint without complication. Post-injection care discussed with patient.  -At no charge mechanically debrided nails using a sterile nail nipper and  callus areas plantar forefoot bilateral using a sterile chisel blade -Recommend continue with okeeffe healthy feet and continue with pumice stone and good supportive shoes ([facility]OKA) with added offloading felt padding submet 5 on right -Continue with work restrictions and advised patient to also have FMLA paperwork sent to office for intermittent leave -Patient to return to office as needed in 9 to 10 weeks for follow-up foot care.  Landis Martins, DPM

## 2020-06-10 ENCOUNTER — Other Ambulatory Visit: Payer: Self-pay | Admitting: Oncology

## 2020-06-10 DIAGNOSIS — D61818 Other pancytopenia: Secondary | ICD-10-CM

## 2020-06-10 NOTE — Progress Notes (Signed)
[facility]  [address] [place],  [place]  [address] [phone]  Clinic Day:  06/11/2020  Referring physician: Charlotte Sanes, MD   HISTORY OF PRESENT ILLNESS:  The patient is a 80 y.o. male with mild pancytopenia.  Extensive lab work done in the past did not reveal any obvious etiology behind his mildly low peripheral counts.  However, as his counts have not been dangerously low, his pancytopenia has been followed conservatively.  He comes in today to for routine followup.  Since his last visit, the patient has been doing okay.  He denies having increased fatigue, bleeding/bleeding issues, or other symptoms/findings which concern him for his pancytopenia getting worse.    PHYSICAL EXAM:  Blood pressure (!) 162/68, pulse 65, temperature 98 F (36.7 C), resp. rate 16, height 5\' 7"  (1.702 m), weight 163 lb 1.6 oz (74 kg), SpO2 97 %. Wt Readings from Last 3 Encounters:  06/23/20 161 lb 12 oz (73.4 kg)  06/11/20 163 lb 1.6 oz (74 kg)   Body mass index is 25.55 kg/m. Performance status (ECOG): 0 - Asymptomatic Physical Exam Constitutional:      Appearance: Normal appearance. He is not ill-appearing.  HENT:     Mouth/Throat:     Mouth: Mucous membranes are moist.     Pharynx: Oropharynx is clear. No oropharyngeal exudate or posterior oropharyngeal erythema.  Cardiovascular:     Rate and Rhythm: Normal rate and regular rhythm.     Heart sounds: No murmur heard. No friction rub. No gallop.   Pulmonary:     Effort: Pulmonary effort is normal. No respiratory distress.     Breath sounds: Normal breath sounds. No wheezing, rhonchi or rales.  Chest:  Breasts:     Right: No axillary adenopathy or supraclavicular adenopathy.     Left: No axillary adenopathy or supraclavicular adenopathy.    Abdominal:     General: Bowel sounds are normal. There is no distension.     Palpations: Abdomen is soft. There is no mass.     Tenderness: There is no  abdominal tenderness.  Musculoskeletal:        General: No swelling.     Right lower leg: No edema.     Left lower leg: No edema.  Lymphadenopathy:     Cervical: No cervical adenopathy.     Upper Body:     Right upper body: No supraclavicular or axillary adenopathy.     Left upper body: No supraclavicular or axillary adenopathy.     Lower Body: No right inguinal adenopathy. No left inguinal adenopathy.  Skin:    General: Skin is warm.     Coloration: Skin is not jaundiced.     Findings: No lesion or rash.  Neurological:     General: No focal deficit present.     Mental Status: He is alert and oriented to person, place, and time. Mental status is at baseline.     Cranial Nerves: Cranial nerves are intact.  Psychiatric:        Mood and Affect: Mood normal.        Behavior: Behavior normal.        Thought Content: Thought content normal.     LABS:   CBC Latest Ref Rng & Units 06/11/2020  WBC - 3.4  Hemoglobin 13.5 - 17.5 11.9(A)  Hematocrit 41 - 53 36(A)  Platelets 150 - 399 101(A)    Ref. Range 06/11/2020 13:57  Iron Latest Ref Range: 45 - 182  ug/dL 75  UIBC Latest Units: ug/dL 305  TIBC Latest Ref Range: 250 - 450 ug/dL 380  Saturation Ratios Latest Ref Range: 17.9 - 39.5 % 20  Ferritin Latest Ref Range: 24 - 336 ng/mL 14 (L)  Folate Latest Ref Range: >5.9 ng/mL 19.8  Vitamin B12 Latest Ref Range: 180 - 914 pg/mL 2,014 (H)    ASSESSMENT & PLAN:  Assessment/Plan:  A 80 y.o. male with  mild pancytopenia.  When comparing his CBC today to what it was at his last visit, his hemoglobin has fallen by nearly 2 grams.  As his ferritin is low, this suggests iron deficiency anemia to where I will give him IV Venofer over this next week to replenish his iron stores and normalize his hemoglobin.  Although low, his white cells and platelets are holding relatively stable.  I will see him back in 3 months to reassess his cytopenias, in particular his hemoglobin, to see how well he responded  to his upcoming IV Venofer.   The patient understands all the plans discussed today and is in agreement with them.    Cristina Mattern Macarthur Critchley, MD

## 2020-06-11 ENCOUNTER — Inpatient Hospital Stay: Payer: Medicare HMO | Admitting: Oncology

## 2020-06-11 ENCOUNTER — Inpatient Hospital Stay: Payer: Medicare HMO | Attending: Oncology

## 2020-06-11 ENCOUNTER — Other Ambulatory Visit: Payer: Self-pay | Admitting: Hematology and Oncology

## 2020-06-11 ENCOUNTER — Other Ambulatory Visit: Payer: Self-pay

## 2020-06-11 ENCOUNTER — Telehealth: Payer: Self-pay | Admitting: Oncology

## 2020-06-11 VITALS — BP 162/68 | HR 65 | Temp 98.0°F | Resp 16 | Ht 67.0 in | Wt 163.1 lb

## 2020-06-11 DIAGNOSIS — D61818 Other pancytopenia: Secondary | ICD-10-CM | POA: Insufficient documentation

## 2020-06-11 DIAGNOSIS — D509 Iron deficiency anemia, unspecified: Secondary | ICD-10-CM | POA: Insufficient documentation

## 2020-06-11 DIAGNOSIS — D508 Other iron deficiency anemias: Secondary | ICD-10-CM | POA: Diagnosis not present

## 2020-06-11 LAB — VITAMIN B12: Vitamin B-12: 2014 pg/mL — ABNORMAL HIGH (ref 180–914)

## 2020-06-11 LAB — CBC
Absolute Lymphocytes: 0.44 — AB (ref 0.65–4.75)
MCV: 94 (ref 80–94)
RBC: 3.84 — AB (ref 3.87–5.11)

## 2020-06-11 LAB — IRON AND TIBC
Iron: 75 ug/dL (ref 45–182)
Saturation Ratios: 20 % (ref 17.9–39.5)
TIBC: 380 ug/dL (ref 250–450)
UIBC: 305 ug/dL

## 2020-06-11 LAB — FOLATE: Folate: 19.8 ng/mL (ref >5.9)

## 2020-06-11 LAB — CBC AND DIFFERENTIAL
HCT: 36 — AB (ref 41–53)
Hemoglobin: 11.9 — AB (ref 13.5–17.5)
Neutrophils Absolute: 2.58
Platelets: 101 — AB (ref 150–399)
WBC: 3.4

## 2020-06-11 LAB — FERRITIN: Ferritin: 14 ng/mL — ABNORMAL LOW (ref 24–336)

## 2020-06-11 NOTE — Telephone Encounter (Signed)
Per 4/28 los next appt scheduled and given to patient 

## 2020-06-12 ENCOUNTER — Telehealth: Payer: Self-pay

## 2020-06-12 DIAGNOSIS — D509 Iron deficiency anemia, unspecified: Secondary | ICD-10-CM | POA: Insufficient documentation

## 2020-06-12 NOTE — Telephone Encounter (Signed)
Called wife and notified her that the patient will need an iron infusion. Also to expect a call from the someone to schedule the iron infusion.

## 2020-06-22 ENCOUNTER — Other Ambulatory Visit: Payer: Self-pay | Admitting: Pharmacist

## 2020-06-23 ENCOUNTER — Other Ambulatory Visit: Payer: Self-pay

## 2020-06-23 ENCOUNTER — Inpatient Hospital Stay: Payer: Medicare HMO | Attending: Oncology

## 2020-06-23 VITALS — BP 135/60 | HR 65 | Temp 98.1°F | Resp 18 | Ht 67.0 in | Wt 161.8 lb

## 2020-06-23 DIAGNOSIS — D61818 Other pancytopenia: Secondary | ICD-10-CM | POA: Diagnosis present

## 2020-06-23 DIAGNOSIS — Z79899 Other long term (current) drug therapy: Secondary | ICD-10-CM | POA: Insufficient documentation

## 2020-06-23 DIAGNOSIS — D508 Other iron deficiency anemias: Secondary | ICD-10-CM

## 2020-06-23 MED ORDER — SODIUM CHLORIDE 0.9 % IV SOLN
200.0000 mg | Freq: Once | INTRAVENOUS | Status: AC
  Administered 2020-06-23: 200 mg via INTRAVENOUS
  Filled 2020-06-23: qty 200

## 2020-06-23 MED ORDER — SODIUM CHLORIDE 0.9 % IV SOLN
Freq: Once | INTRAVENOUS | Status: AC
  Filled 2020-06-23: qty 250

## 2020-06-23 MED FILL — Iron Sucrose Inj 20 MG/ML (Fe Equiv): INTRAVENOUS | Qty: 10 | Status: AC

## 2020-06-23 NOTE — Patient Instructions (Signed)

## 2020-06-24 ENCOUNTER — Ambulatory Visit: Payer: Medicare HMO

## 2020-06-26 ENCOUNTER — Telehealth: Payer: Self-pay | Admitting: Oncology

## 2020-06-26 NOTE — Telephone Encounter (Signed)
Due to patient having COVID - Patient's Iron Inf (Venofer) reascheduled to 5/31, 6/3, 6/8, 6/9

## 2020-06-29 ENCOUNTER — Inpatient Hospital Stay: Payer: Medicare HMO

## 2020-06-30 ENCOUNTER — Inpatient Hospital Stay: Payer: Medicare HMO

## 2020-07-02 ENCOUNTER — Inpatient Hospital Stay: Payer: Medicare HMO

## 2020-07-03 ENCOUNTER — Inpatient Hospital Stay: Payer: Medicare HMO

## 2020-07-07 ENCOUNTER — Ambulatory Visit: Payer: Medicare HMO | Admitting: Sports Medicine

## 2020-07-08 ENCOUNTER — Ambulatory Visit: Payer: Medicare HMO | Admitting: Sports Medicine

## 2020-07-14 ENCOUNTER — Other Ambulatory Visit: Payer: Self-pay

## 2020-07-14 ENCOUNTER — Inpatient Hospital Stay: Payer: Medicare HMO

## 2020-07-14 VITALS — BP 136/61 | HR 71 | Temp 98.2°F | Resp 18 | Ht 67.0 in | Wt 162.0 lb

## 2020-07-14 DIAGNOSIS — D61818 Other pancytopenia: Secondary | ICD-10-CM | POA: Diagnosis not present

## 2020-07-14 DIAGNOSIS — D508 Other iron deficiency anemias: Secondary | ICD-10-CM

## 2020-07-14 MED ORDER — SODIUM CHLORIDE 0.9 % IV SOLN
200.0000 mg | Freq: Once | INTRAVENOUS | Status: AC
  Administered 2020-07-14: 200 mg via INTRAVENOUS
  Filled 2020-07-14: qty 200

## 2020-07-14 MED ORDER — SODIUM CHLORIDE 0.9 % IV SOLN
Freq: Once | INTRAVENOUS | Status: AC
  Filled 2020-07-14: qty 250

## 2020-07-14 NOTE — Patient Instructions (Signed)

## 2020-07-16 ENCOUNTER — Ambulatory Visit: Payer: Medicare HMO

## 2020-07-17 ENCOUNTER — Other Ambulatory Visit: Payer: Self-pay

## 2020-07-17 ENCOUNTER — Inpatient Hospital Stay: Payer: Medicare HMO | Attending: Oncology

## 2020-07-17 VITALS — BP 140/63 | HR 62 | Temp 98.2°F | Resp 18 | Ht 67.0 in | Wt 165.1 lb

## 2020-07-17 DIAGNOSIS — D508 Other iron deficiency anemias: Secondary | ICD-10-CM

## 2020-07-17 DIAGNOSIS — Z79899 Other long term (current) drug therapy: Secondary | ICD-10-CM | POA: Diagnosis not present

## 2020-07-17 DIAGNOSIS — D509 Iron deficiency anemia, unspecified: Secondary | ICD-10-CM | POA: Insufficient documentation

## 2020-07-17 MED ORDER — SODIUM CHLORIDE 0.9 % IV SOLN
Freq: Once | INTRAVENOUS | Status: AC
  Filled 2020-07-17: qty 250

## 2020-07-17 MED ORDER — SODIUM CHLORIDE 0.9 % IV SOLN
200.0000 mg | Freq: Once | INTRAVENOUS | Status: AC
  Administered 2020-07-17: 200 mg via INTRAVENOUS
  Filled 2020-07-17: qty 200

## 2020-07-17 NOTE — Progress Notes (Signed)
1353: PT STABLE AT TIME OF DISCHARGE  

## 2020-07-17 NOTE — Patient Instructions (Signed)

## 2020-07-22 ENCOUNTER — Encounter: Payer: Self-pay | Admitting: Sports Medicine

## 2020-07-22 ENCOUNTER — Other Ambulatory Visit: Payer: Self-pay

## 2020-07-22 ENCOUNTER — Ambulatory Visit: Payer: Medicare HMO | Admitting: Sports Medicine

## 2020-07-22 ENCOUNTER — Other Ambulatory Visit: Payer: Self-pay | Admitting: Pharmacist

## 2020-07-22 ENCOUNTER — Inpatient Hospital Stay: Payer: Medicare HMO

## 2020-07-22 VITALS — BP 114/56 | HR 67 | Temp 98.1°F | Resp 18 | Ht 67.0 in | Wt 164.0 lb

## 2020-07-22 DIAGNOSIS — M216X9 Other acquired deformities of unspecified foot: Secondary | ICD-10-CM

## 2020-07-22 DIAGNOSIS — L603 Nail dystrophy: Secondary | ICD-10-CM | POA: Diagnosis not present

## 2020-07-22 DIAGNOSIS — Z7901 Long term (current) use of anticoagulants: Secondary | ICD-10-CM

## 2020-07-22 DIAGNOSIS — D509 Iron deficiency anemia, unspecified: Secondary | ICD-10-CM | POA: Diagnosis not present

## 2020-07-22 DIAGNOSIS — D508 Other iron deficiency anemias: Secondary | ICD-10-CM

## 2020-07-22 DIAGNOSIS — G629 Polyneuropathy, unspecified: Secondary | ICD-10-CM

## 2020-07-22 DIAGNOSIS — M79672 Pain in left foot: Secondary | ICD-10-CM

## 2020-07-22 DIAGNOSIS — L84 Corns and callosities: Secondary | ICD-10-CM

## 2020-07-22 DIAGNOSIS — M79671 Pain in right foot: Secondary | ICD-10-CM

## 2020-07-22 MED ORDER — SODIUM CHLORIDE 0.9 % IV SOLN
Freq: Once | INTRAVENOUS | Status: AC
  Filled 2020-07-22: qty 250

## 2020-07-22 MED ORDER — SODIUM CHLORIDE 0.9 % IV SOLN
200.0000 mg | Freq: Once | INTRAVENOUS | Status: AC
  Administered 2020-07-22: 200 mg via INTRAVENOUS
  Filled 2020-07-22: qty 200

## 2020-07-22 NOTE — Patient Instructions (Signed)

## 2020-07-22 NOTE — Progress Notes (Signed)
Subjective: Sean [name] is a 80 y.o. male patient who returns to office for follow up evaluation of bilateral foot pain reports that his sore underneath his callus areas and states that he is having more balance problems due to his neuropathy.  Patient reports that he is still on his warfarin and also has a difficult time trimming his toenails.  Patient denies any other pedal complaints at this time.  Patient Active Problem List   Diagnosis Date Noted  . Iron deficiency anemia 06/12/2020  . Basal ganglia hemorrhage ([facility]) 11/17/2017  . Cerebral infarction ([facility]) 11/17/2017  . Chest discomfort 11/01/2017  . H/O: CVA (cerebrovascular accident) 11/01/2017  . CAD in native artery 12/23/2016  . Current use of long term anticoagulation 12/23/2016  . H/O heart artery stent 12/23/2016  . Malaise and fatigue 12/23/2016  . PAF (paroxysmal atrial fibrillation) ([facility]) 12/23/2016  . 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 acetaminophen (TYLENOL) 325 MG tablet Take by mouth.    [facility] PRESERVATIVE FREE injection     . amoxicillin (AMOXIL) 500 MG capsule amoxicillin 500 mg capsule    . aspirin 81 MG chewable tablet Chew 81 mg by mouth.    . cetirizine (ZYRTEC) 10 MG tablet TK 1 T PO D PRF COG    . Cholecalciferol 125 MCG (5000 UT) TABS cholecalciferol (vitamin D3) 125 mcg (5,000 unit) tablet   5000 units by oral route.    . clotrimazole-betamethasone (LOTRISONE) cream clotrimazole-betamethasone 1 %-0.05 % topical cream  APPLY TOPICALLY TO THE AFFECTED AND SURROUNDING AREAS TWICE DAILY IN THE MORNING AND IN THE EVENING AS NEEDED    . dutasteride (AVODART) 0.5 MG capsule     . enoxaparin (LOVENOX) 80 MG/0.8ML injection INJECT 0.8 MILLILITER AS DIRECTED TWICE DAILY ON SCHEDULE GIVEN BY DOCTORS OFFICE.    Marland Kitchen gabapentin (NEURONTIN) 300 MG capsule Take 1 capsule (300 mg total) by mouth 3 (three) times daily.  1 capsule at lunchtime and 2 at bedtime 90 capsule 3  . Influenza Vac Split High-Dose 0.5 ML SUSY     . Influenza Vac Typ A&B Surf Ant 0.5 ML SUSY Fluad 2017-18 30yr up(PF)45 mcg(15 mcgx3)/0.5 mL intramuscular syringe    . Magnesium Oxide 400 MG CAPS magnesium 400 mg (as magnesium oxide) capsule  take 1 tab 3x  a week do not take with gabapentin at the same time    . metoprolol (LOPRESSOR) 50 MG tablet     . nitroGLYCERIN (NITROSTAT) 0.4 MG SL tablet     . omeprazole (PRILOSEC) 40 MG capsule Take 40 mg by mouth daily.  0  . pravastatin (PRAVACHOL) 20 MG tablet pravastatin 20 mg tablet  Take 1 tablet every day by oral route.    . tamsulosin (FLOMAX) 0.4 MG CAPS capsule Take by mouth.    . triamcinolone ointment (KENALOG) 0.5 % triamcinolone acetonide 0.5 % topical ointment    . vitamin B-12 (CYANOCOBALAMIN) 500 MCG tablet Take by mouth.    . Vitamin D, Ergocalciferol, (DRISDOL) 50000 UNITS CAPS capsule   0   Current Facility-Administered Medications on File Prior to Visit  Medication Dose Route Frequency Provider Last Rate Last Admin  . triamcinolone acetonide (KENALOG) 10 MG/ML injection 10 mg  10 mg Other Once Asencion Islam, DPM        Allergies  Allergen Reactions  . Meperidine Hcl Nausea Only  . Demerol [Meperidine] Nausea And Vomiting  Objective:  General: Alert and oriented x3 in no acute distress  Dermatology: Minimal reactive keratosis sub met 1 and 5 bilateral. No open lesions bilateral lower extremities, no webspace macerations, no ecchymosis bilateral, all nails x 10 are are mildly elongated and dystrophic.  Vascular: Dorsalis Pedis and Posterior Tibial pedal pulses palpable, Capillary Fill Time 3 seconds,(+) pedal hair growth bilateral, no edema bilateral lower extremities, Temperature gradient within normal limits.  Neurology: Gross sensation intact via light touch bilateral, subjective sharp pains to toes that have lessened with gabapentin.  Musculoskeletal: There  is reproducible tenderness to palpation to both feet segment 5, prominent fifth metatarsal heads bilateral right greater than left with cavovarus foot deformity.  Range of motion within normal limits except at midtarsal joint and at hammertoes. Strength within normal limits in all groups bilateral.   Assessment and Plan: Problem List Items Addressed This Visit      Other   Current use of long term anticoagulation    Other Visit Diagnoses    Nail dystrophy    -  Primary   Callus of foot       Neuropathy       Cavus deformity of foot, acquired       Pain in both feet          -Complete examination performed -Re-Discussed treatement options and continued care for neuropathy secondary to radiculopathy and callus and nails -Mechanically debrided nails x10 using a sterile nipper without incident -At no additional charge mechanically debrided calluses x4 using a sterile chisel blade without incident -Applied offloading padding tissues bilateral -Continue with Gabapentin with adjustments as directed by neurologist -Continue with intermittent leave to allow accommodations for possible flareups of pain -Patient to return to office as needed or in 3 to 4 months for nail/callus care or sooner if condition worsens.  Advised patient to consider fifth metatarsal head removal if pain continues or a repeat injection.  Landis Martins, DPM

## 2020-07-23 ENCOUNTER — Inpatient Hospital Stay: Payer: Medicare HMO

## 2020-07-23 VITALS — BP 132/71 | HR 64 | Temp 98.2°F | Resp 18 | Ht 67.0 in | Wt 161.0 lb

## 2020-07-23 DIAGNOSIS — D508 Other iron deficiency anemias: Secondary | ICD-10-CM

## 2020-07-23 DIAGNOSIS — D509 Iron deficiency anemia, unspecified: Secondary | ICD-10-CM | POA: Diagnosis not present

## 2020-07-23 MED ORDER — FAMOTIDINE IN NACL 20-0.9 MG/50ML-% IV SOLN: 20.0000 mg | Freq: Once | INTRAVENOUS | Status: DC | PRN

## 2020-07-23 MED ORDER — HEPARIN SOD (PORK) LOCK FLUSH 100 UNIT/ML IV SOLN
500.0000 [IU] | Freq: Once | INTRAVENOUS | Status: DC | PRN
  Filled 2020-07-23: qty 5

## 2020-07-23 MED ORDER — METHYLPREDNISOLONE SODIUM SUCC 125 MG IJ SOLR: 125.0000 mg | Freq: Once | INTRAMUSCULAR | Status: DC | PRN

## 2020-07-23 MED ORDER — SODIUM CHLORIDE 0.9 % IV SOLN
Freq: Once | INTRAVENOUS | Status: DC | PRN
  Filled 2020-07-23: qty 250

## 2020-07-23 MED ORDER — EPINEPHRINE 0.3 MG/0.3ML IJ SOAJ: 0.3000 mg | Freq: Once | INTRAMUSCULAR | Status: DC | PRN

## 2020-07-23 MED ORDER — SODIUM CHLORIDE 0.9 % IV SOLN
Freq: Once | INTRAVENOUS | Status: AC
  Filled 2020-07-23: qty 250

## 2020-07-23 MED ORDER — HEPARIN SOD (PORK) LOCK FLUSH 100 UNIT/ML IV SOLN
250.0000 [IU] | Freq: Once | INTRAVENOUS | Status: DC | PRN
  Filled 2020-07-23: qty 5

## 2020-07-23 MED ORDER — ALBUTEROL SULFATE (2.5 MG/3ML) 0.083% IN NEBU
2.5000 mg | INHALATION_SOLUTION | Freq: Once | RESPIRATORY_TRACT | Status: DC | PRN
  Filled 2020-07-23: qty 3

## 2020-07-23 MED ORDER — SODIUM CHLORIDE 0.9 % IV SOLN
200.0000 mg | Freq: Once | INTRAVENOUS | Status: AC
  Administered 2020-07-23: 200 mg via INTRAVENOUS
  Filled 2020-07-23: qty 200

## 2020-07-23 MED ORDER — SODIUM CHLORIDE 0.9% FLUSH
3.0000 mL | Freq: Once | INTRAVENOUS | Status: DC | PRN
  Filled 2020-07-23: qty 10

## 2020-07-23 MED ORDER — SODIUM CHLORIDE 0.9% FLUSH
10.0000 mL | Freq: Once | INTRAVENOUS | Status: DC | PRN
  Filled 2020-07-23: qty 10

## 2020-07-23 MED ORDER — DIPHENHYDRAMINE HCL 50 MG/ML IJ SOLN: 50.0000 mg | Freq: Once | INTRAMUSCULAR | Status: DC | PRN

## 2020-07-23 MED ORDER — ALTEPLASE 2 MG IJ SOLR
2.0000 mg | Freq: Once | INTRAMUSCULAR | Status: DC | PRN
  Filled 2020-07-23: qty 2

## 2020-07-23 NOTE — Patient Instructions (Signed)

## 2020-09-02 ENCOUNTER — Encounter: Payer: Self-pay | Admitting: Sports Medicine

## 2020-09-10 ENCOUNTER — Ambulatory Visit: Payer: Medicare HMO | Admitting: Oncology

## 2020-09-10 ENCOUNTER — Other Ambulatory Visit: Payer: Medicare HMO

## 2020-09-11 ENCOUNTER — Other Ambulatory Visit: Payer: Self-pay

## 2020-09-11 ENCOUNTER — Encounter: Payer: Self-pay | Admitting: Hematology and Oncology

## 2020-09-11 ENCOUNTER — Inpatient Hospital Stay: Payer: Medicare HMO | Admitting: Hematology and Oncology

## 2020-09-11 ENCOUNTER — Telehealth: Payer: Self-pay | Admitting: Hematology and Oncology

## 2020-09-11 ENCOUNTER — Inpatient Hospital Stay: Payer: Medicare HMO | Attending: Oncology

## 2020-09-11 VITALS — BP 142/63 | HR 62 | Temp 97.9°F | Resp 16 | Ht 67.0 in | Wt 168.9 lb

## 2020-09-11 DIAGNOSIS — D61818 Other pancytopenia: Secondary | ICD-10-CM

## 2020-09-11 LAB — CBC AND DIFFERENTIAL
HCT: 37 — AB (ref 41–53)
Hemoglobin: 12.5 — AB (ref 13.5–17.5)
Neutrophils Absolute: 1.69
Platelets: 83 — AB (ref 150–399)
WBC: 2.6

## 2020-09-11 LAB — CBC: RBC: 3.84 — AB (ref 3.87–5.11)

## 2020-09-11 NOTE — Progress Notes (Signed)
[facility]  [address] [place],  [place]  [address] [phone]  Clinic Day:  06/11/2020  Referring physician: Charlotte Sanes, MD   HISTORY OF PRESENT ILLNESS:  The patient is a 80 y.o. male with mild pancytopenia.  Extensive lab work done in the past did not reveal any obvious etiology behind his mildly low peripheral counts.  However, as his counts have not been dangerously low, his pancytopenia has been followed conservatively.  He comes in today to for routine followup.  Since his last visit, the patient has been doing okay. He continues to have issues with neuropathy for which he is managed by neurology and podiatry with gabapentin. He denies fever, chills, nausea or vomiting. He denies shortness of breath, chest pain or cough. He denies issue with bowel or bladder. His appetite is good and is weight is up 5 pounds since last visit. CBC today reveals hemoglobin 12.5 (increased from last visit), and white count 2.6, ANC 1.69, platelets 83 (all decreased since last visit).   PHYSICAL EXAM:  Blood pressure (!) 142/63, pulse 62, temperature 97.9 F (36.6 C), resp. rate 16, height '5\' 7"'$  (1.702 m), weight 168 lb 14.4 oz (76.6 kg), SpO2 99 %. Wt Readings from Last 3 Encounters:  09/11/20 168 lb 14.4 oz (76.6 kg)  07/23/20 161 lb (73 kg)  07/22/20 164 lb (74.4 kg)   Body mass index is 26.45 kg/m. Performance status (ECOG): 0 - Asymptomatic Physical Exam Constitutional:      General: He is not in acute distress.    Appearance: Normal appearance. He is normal weight. He is not ill-appearing, toxic-appearing or diaphoretic.  HENT:     Head: Normocephalic and atraumatic.     Nose: Nose normal. No congestion or rhinorrhea.     Mouth/Throat:     Mouth: Mucous membranes are moist.     Pharynx: Oropharynx is clear. No oropharyngeal exudate or posterior oropharyngeal erythema.  Eyes:     General: No scleral icterus.       Right eye: No discharge.         Left eye: No discharge.     Extraocular Movements: Extraocular movements intact.     Conjunctiva/sclera: Conjunctivae normal.     Pupils: Pupils are equal, round, and reactive to light.  Neck:     Vascular: No carotid bruit.  Cardiovascular:     Rate and Rhythm: Normal rate and regular rhythm.     Heart sounds: No murmur heard.   No friction rub. No gallop.  Pulmonary:     Effort: Pulmonary effort is normal. No respiratory distress.     Breath sounds: Normal breath sounds. No stridor. No wheezing, rhonchi or rales.  Chest:     Chest wall: No tenderness.  Breasts:    Right: No axillary adenopathy or supraclavicular adenopathy.     Left: No axillary adenopathy or supraclavicular adenopathy.  Abdominal:     General: Abdomen is flat. Bowel sounds are normal. There is no distension.     Palpations: Abdomen is soft. There is no mass.     Tenderness: There is no abdominal tenderness. There is no right CVA tenderness, left CVA tenderness, guarding or rebound.     Hernia: No hernia is present.  Musculoskeletal:        General: No swelling, tenderness, deformity or signs of injury. Normal range of motion.     Cervical back: Normal range of motion and neck supple. No rigidity or tenderness.  Right lower leg: No edema.     Left lower leg: No edema.  Lymphadenopathy:     Cervical: No cervical adenopathy.     Upper Body:     Right upper body: No supraclavicular or axillary adenopathy.     Left upper body: No supraclavicular or axillary adenopathy.     Lower Body: No right inguinal adenopathy. No left inguinal adenopathy.  Skin:    General: Skin is warm and dry.     Capillary Refill: Capillary refill takes less than 2 seconds.     Coloration: Skin is not jaundiced or pale.     Findings: No bruising, erythema, lesion or rash.  Neurological:     General: No focal deficit present.     Mental Status: He is alert and oriented to person, place, and time. Mental status is at baseline.      Cranial Nerves: Cranial nerves are intact. No cranial nerve deficit.     Sensory: No sensory deficit.     Motor: No weakness.     Coordination: Coordination normal.     Gait: Gait normal.     Deep Tendon Reflexes: Reflexes normal.  Psychiatric:        Mood and Affect: Mood normal.        Behavior: Behavior normal.        Thought Content: Thought content normal.        Judgment: Judgment normal.    LABS:   CBC Latest Ref Rng & Units 09/11/2020 06/11/2020  WBC - 2.6 3.4  Hemoglobin 13.5 - 17.5 12.5(A) 11.9(A)  Hematocrit 41 - 53 37(A) 36(A)  Platelets 150 - 399 83(A) 101(A)     ASSESSMENT & PLAN:  Assessment/Plan:  A 80 y.o. male with  mild pancytopenia.  When comparing his CBC today to what it was at his last visit, his hemoglobin has increased. His white count, ANC and platelets, however, have all decreased with his platelets now falling below 100. He denies any bleeding. He continues to work. We discussed continuing interventions to protect his immune system including wearing his mask, using hand sanitizer, good hand washing and avoiding large crowds as well as anyone who is obviously sick. I will have him return in one month for re-evaluation with Dr. Bobby Rumpf.  He verbalizes understanding of and agreement to the plans discussed today. He knows to call the office should any new questions or concerns arise.   Melodye Ped, NP

## 2020-09-11 NOTE — Telephone Encounter (Signed)
Per 7/29 LOS, patient scheduled for Aug Appt's.  Gave patient Appt Summary

## 2020-10-01 NOTE — Progress Notes (Signed)
[name]  [address] [place],  [place]  [address] [phone]  Clinic Day:  10/09/2020  Referring physician: Charlotte Sanes, MD  This document serves as a record of services personally performed by [name], MD. It was created on their behalf by Curry,Lauren E, a trained medical scribe. The creation of this record is based on the scribe's personal observations and the provider's statements to them.  HISTORY OF PRESENT ILLNESS:  The patient is a 80 y.o. male with mild pancytopenia.  Extensive lab work done in the past did not reveal any obvious etiology behind his mildly low peripheral counts.  Of note, this gentleman did receive IV iron in May/June 2022 as labs showed a low ferritin level.  As his counts have not been dangerously low, his pancytopenia has been followed conservatively.  He comes in today to for routine followup.  Since his last visit, the patient has been doing fine.  He continues to have peripheral neuropathy, which is being managed by neurology and podiatry with gabapentin.   PHYSICAL EXAM:  Blood pressure (!) 148/66, pulse 61, temperature 97.8 F (36.6 C), resp. rate 16, height $RemoveBe'5\' 7"'XjNuAfJlu$  (1.702 m), weight 168 lb 3.2 oz (76.3 kg), SpO2 98 %. Wt Readings from Last 3 Encounters:  10/09/20 168 lb 3.2 oz (76.3 kg)  09/11/20 168 lb 14.4 oz (76.6 kg)  07/23/20 161 lb (73 kg)   Body mass index is 26.34 kg/m. Performance status (ECOG): 0 - Asymptomatic Physical Exam Constitutional:      General: He is not in acute distress.    Appearance: Normal appearance. He is normal weight. He is not ill-appearing, toxic-appearing or diaphoretic.  HENT:     Head: Normocephalic and atraumatic.     Nose: Nose normal. No congestion or rhinorrhea.     Mouth/Throat:     Mouth: Mucous membranes are moist.     Pharynx: Oropharynx is clear. No oropharyngeal exudate or posterior oropharyngeal erythema.  Eyes:     General: No scleral icterus.        Right eye: No discharge.        Left eye: No discharge.     Extraocular Movements: Extraocular movements intact.     Conjunctiva/sclera: Conjunctivae normal.     Pupils: Pupils are equal, round, and reactive to light.  Neck:     Vascular: No carotid bruit.  Cardiovascular:     Rate and Rhythm: Normal rate and regular rhythm.     Heart sounds: No murmur heard.   No friction rub. No gallop.  Pulmonary:     Effort: Pulmonary effort is normal. No respiratory distress.     Breath sounds: Normal breath sounds. No stridor. No wheezing, rhonchi or rales.  Chest:     Chest wall: No tenderness.  Abdominal:     General: Abdomen is flat. Bowel sounds are normal. There is no distension.     Palpations: Abdomen is soft. There is no mass.     Tenderness: There is no abdominal tenderness. There is no right CVA tenderness, left CVA tenderness, guarding or rebound.     Hernia: No hernia is present.  Musculoskeletal:        General: No swelling, tenderness, deformity or signs of injury. Normal range of motion.     Cervical back: Normal range of motion and neck supple. No rigidity or tenderness.     Right lower leg: No edema.     Left lower leg: No edema.  Lymphadenopathy:     Cervical: No cervical adenopathy.     Upper Body:     Right upper body: No supraclavicular or axillary adenopathy.     Left upper body: No supraclavicular or axillary adenopathy.     Lower Body: No right inguinal adenopathy. No left inguinal adenopathy.  Skin:    General: Skin is warm and dry.     Capillary Refill: Capillary refill takes less than 2 seconds.     Coloration: Skin is not jaundiced or pale.     Findings: No bruising, erythema, lesion or rash.  Neurological:     General: No focal deficit present.     Mental Status: He is alert and oriented to person, place, and time. Mental status is at baseline.     Cranial Nerves: Cranial nerves are intact. No cranial nerve deficit.     Sensory: No sensory deficit.      Motor: No weakness.     Coordination: Coordination normal.     Gait: Gait normal.     Deep Tendon Reflexes: Reflexes normal.  Psychiatric:        Mood and Affect: Mood normal.        Behavior: Behavior normal.        Thought Content: Thought content normal.        Judgment: Judgment normal.   LABS:   CBC Latest Ref Rng & Units 10/09/2020 09/11/2020 06/11/2020  WBC - 2.6 2.6 3.4  Hemoglobin 13.5 - 17.5 12.2(A) 12.5(A) 11.9(A)  Hematocrit 41 - 53 36(A) 37(A) 36(A)  Platelets 150 - 399 90(A) 83(A) 101(A)    ASSESSMENT & PLAN:  Assessment/Plan:  A 80 y.o. male with  mild pancytopenia.  When comparing his CBC today to what it was at his last visit, his peripheral counts remain essentially unchanged.  His hemoglobin remains essentially unchanged despite receiving IV iron 2 months ago.   Despite this, he is clinically doing well.  He understands I would not entertain a bone marrow biopsy unless there are rather abrupt changes in his counts that require immediate marrow evaluation.  I will see him back in 6 months for repeat clinical assessment.  The patient understands all the plans discussed today and is in agreement with them.     I, Rita Ohara, am acting as scribe for [name], MD    I have reviewed this report as typed by the medical scribe, and it is complete and accurate.  Sean Merriott Macarthur Critchley, MD

## 2020-10-09 ENCOUNTER — Inpatient Hospital Stay: Payer: Medicare HMO | Attending: Oncology

## 2020-10-09 ENCOUNTER — Encounter: Payer: Self-pay | Admitting: Oncology

## 2020-10-09 ENCOUNTER — Inpatient Hospital Stay: Payer: Medicare HMO | Admitting: Oncology

## 2020-10-09 ENCOUNTER — Other Ambulatory Visit: Payer: Self-pay | Admitting: Oncology

## 2020-10-09 VITALS — BP 148/66 | HR 61 | Temp 97.8°F | Resp 16 | Ht 67.0 in | Wt 168.2 lb

## 2020-10-09 DIAGNOSIS — D61818 Other pancytopenia: Secondary | ICD-10-CM

## 2020-10-09 DIAGNOSIS — D508 Other iron deficiency anemias: Secondary | ICD-10-CM

## 2020-10-09 LAB — CBC AND DIFFERENTIAL
HCT: 36 — AB (ref 41–53)
Hemoglobin: 12.2 — AB (ref 13.5–17.5)
Neutrophils Absolute: 1.82
Platelets: 90 — AB (ref 150–399)
WBC: 2.6

## 2020-10-09 LAB — CBC: RBC: 3.82 — AB (ref 3.87–5.11)

## 2020-10-23 ENCOUNTER — Other Ambulatory Visit: Payer: Self-pay

## 2020-10-23 ENCOUNTER — Ambulatory Visit: Payer: Medicare HMO | Admitting: Sports Medicine

## 2020-10-23 ENCOUNTER — Encounter: Payer: Self-pay | Admitting: Sports Medicine

## 2020-10-23 DIAGNOSIS — G629 Polyneuropathy, unspecified: Secondary | ICD-10-CM

## 2020-10-23 DIAGNOSIS — Z7901 Long term (current) use of anticoagulants: Secondary | ICD-10-CM

## 2020-10-23 DIAGNOSIS — L84 Corns and callosities: Secondary | ICD-10-CM

## 2020-10-23 DIAGNOSIS — M216X9 Other acquired deformities of unspecified foot: Secondary | ICD-10-CM | POA: Diagnosis not present

## 2020-10-23 DIAGNOSIS — I739 Peripheral vascular disease, unspecified: Secondary | ICD-10-CM

## 2020-10-23 DIAGNOSIS — L603 Nail dystrophy: Secondary | ICD-10-CM

## 2020-10-23 DIAGNOSIS — M79671 Pain in right foot: Secondary | ICD-10-CM

## 2020-10-23 DIAGNOSIS — M79672 Pain in left foot: Secondary | ICD-10-CM

## 2020-10-23 NOTE — Patient Instructions (Signed)
Nervive nerve relief supplement for your nerves can be purchased OTC at [facility]  

## 2020-10-23 NOTE — Progress Notes (Signed)
Subjective: Jamisen Duerson is a 80 y.o. male patient who returns to office for follow up evaluation of bilateral foot pain reports that he feet feel the same constant neuropathy pains. Reports that [employer] shoes help greatly. No other issues noted.  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     AFLURIA PRESERVATIVE FREE injection      amoxicillin (AMOXIL) 500 MG capsule amoxicillin 500 mg capsule     aspirin 81 MG chewable tablet Chew 81 mg by mouth.     cetirizine (ZYRTEC) 10 MG tablet TK 1 T PO D PRF COG     Cholecalciferol 125 MCG (5000 UT) TABS cholecalciferol (vitamin D3) 125 mcg (5,000 unit) tablet   5000 units by oral route.     clotrimazole-betamethasone (LOTRISONE) cream clotrimazole-betamethasone 1 %-0.05 % topical cream  APPLY TOPICALLY TO THE AFFECTED AND SURROUNDING AREAS TWICE DAILY IN THE MORNING AND IN THE EVENING AS NEEDED     dutasteride (AVODART) 0.5 MG capsule      enoxaparin (LOVENOX) 80 MG/0.8ML injection INJECT 0.8 MILLILITER AS DIRECTED TWICE DAILY ON SCHEDULE GIVEN BY DOCTORS OFFICE.     gabapentin (NEURONTIN) 300 MG capsule Take 1 capsule (300 mg total) by mouth 3 (three) times daily. 1 capsule at lunchtime and 2 at bedtime 90 capsule 3   Influenza Vac Split High-Dose 0.5 ML SUSY      Influenza Vac Typ A&B Surf Ant 0.5 ML  SUSY Fluad 2017-18 50yr up(PF)45 mcg(15 mcgx3)/0.5 mL intramuscular syringe     Magnesium Oxide 400 MG CAPS magnesium 400 mg (as magnesium oxide) capsule  take 1 tab 3x  a week do not take with gabapentin at the same time     metoprolol (LOPRESSOR) 50 MG tablet      nitroGLYCERIN (NITROSTAT) 0.4 MG SL tablet      omeprazole (PRILOSEC) 40 MG capsule Take 40 mg by mouth daily.  0   pravastatin (PRAVACHOL) 20 MG tablet pravastatin 20 mg tablet  Take 1 tablet every day by oral route.     tamsulosin (FLOMAX) 0.4 MG CAPS capsule Take by mouth.     triamcinolone ointment (KENALOG) 0.5 % triamcinolone acetonide 0.5 % topical ointment     vitamin B-12 (CYANOCOBALAMIN) 500 MCG tablet Take by mouth.     Vitamin D, Ergocalciferol, (DRISDOL) 50000 UNITS CAPS capsule   0   Current Facility-Administered Medications on File Prior to Visit  Medication Dose Route Frequency Provider Last Rate Last Admin   triamcinolone acetonide (KENALOG) 10 MG/ML injection 10 mg  10 mg Other Once Landis Martins, DPM        Allergies  Allergen Reactions   Meperidine Hcl Nausea Only   Demerol [Meperidine] Nausea And Vomiting    Objective:  General: Alert and oriented x3 in no acute distress  Dermatology: Minimal reactive keratosis sub met 1 and 5 bilateral.  No open lesions bilateral lower extremities, no webspace macerations, no ecchymosis bilateral, all nails x 10 are are mildly elongated and dystrophic.  Vascular: Dorsalis Pedis and Posterior Tibial pedal pulses palpable, Capillary Fill Time 3 seconds,(+) pedal hair growth bilateral, no edema bilateral lower extremities, Temperature gradient within normal limits. Venous hyperpigmentation to shins bilateral.  Neurology: Gross sensation intact via light touch bilateral, subjective sharp pains to toes that have lessened with gabapentin.  Musculoskeletal: There is reproducible tenderness to palpation to both feet submet 5, prominent fifth metatarsal heads bilateral right  greater than left with cavovarus foot deformity.  Range of motion within normal limits except at midtarsal joint and at hammertoes. Strength within normal limits in all groups bilateral.   Assessment and Plan: Problem List Items Addressed This Visit       Other   Current use of long term anticoagulation   Other Visit Diagnoses     Nail dystrophy    -  Primary   Callus of foot       Neuropathy       Cavus deformity of foot, acquired       Pain in both feet       PVD (peripheral vascular disease) ([place])             -Complete examination performed -Re-Discussed treatement options and continued care for neuropathy secondary to radiculopathy and callus and nails -Mechanically debrided nails x10 using a sterile nipper without incident -At no additional charge mechanically debrided calluses x2 using a sterile chisel blade without incident -Continue with Gabapentin with adjustments as directed by neurologist -Advised OTC nervive nerve relief -Recommend compression for venous skin changes -Continue with intermittent leave to allow accommodations for possible flareups of pain; 1 day per month -Patient to return to office as needed or in 3 to 4 months for nail/callus care or sooner if condition worsens.   Landis Martins, DPM

## 2020-11-09 ENCOUNTER — Telehealth: Payer: Self-pay | Admitting: Oncology

## 2021-01-15 ENCOUNTER — Ambulatory Visit: Payer: Medicare HMO | Admitting: Sports Medicine

## 2021-01-15 ENCOUNTER — Encounter: Payer: Self-pay | Admitting: Sports Medicine

## 2021-01-15 DIAGNOSIS — M216X9 Other acquired deformities of unspecified foot: Secondary | ICD-10-CM | POA: Diagnosis not present

## 2021-01-15 DIAGNOSIS — M79671 Pain in right foot: Secondary | ICD-10-CM

## 2021-01-15 DIAGNOSIS — L603 Nail dystrophy: Secondary | ICD-10-CM

## 2021-01-15 DIAGNOSIS — G629 Polyneuropathy, unspecified: Secondary | ICD-10-CM

## 2021-01-15 DIAGNOSIS — L84 Corns and callosities: Secondary | ICD-10-CM

## 2021-01-15 DIAGNOSIS — M79672 Pain in left foot: Secondary | ICD-10-CM

## 2021-01-15 NOTE — Progress Notes (Signed)
Subjective: Sean [name] is a 80 y.o. male patient who returns to office for follow up evaluation of bilateral foot pain reports that he feet feel the same constant neuropathy pains, same as previous states that his [employer] tennis shoes continue to help. No other issues noted.  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     AFLURIA PRESERVATIVE FREE injection      amoxicillin (AMOXIL) 500 MG capsule amoxicillin 500 mg capsule     aspirin 81 MG chewable tablet Chew 81 mg by mouth.     cetirizine (ZYRTEC) 10 MG tablet TK 1 T PO D PRF COG     Cholecalciferol 125 MCG (5000 UT) TABS cholecalciferol (vitamin D3) 125 mcg (5,000 unit) tablet   5000 units by oral route.     clotrimazole-betamethasone (LOTRISONE) cream clotrimazole-betamethasone 1 %-0.05 % topical cream  APPLY TOPICALLY TO THE AFFECTED AND SURROUNDING AREAS TWICE DAILY IN THE MORNING AND IN THE EVENING AS NEEDED     dutasteride (AVODART) 0.5 MG capsule      enoxaparin (LOVENOX) 80 MG/0.8ML injection INJECT 0.8 MILLILITER AS DIRECTED TWICE DAILY ON SCHEDULE GIVEN BY DOCTORS OFFICE.     gabapentin (NEURONTIN) 300 MG capsule Take 1 capsule (300 mg total) by mouth 3 (three) times daily. 1 capsule at lunchtime and 2 at bedtime 90 capsule 3   Influenza Vac Split High-Dose 0.5 ML SUSY       Influenza Vac Typ A&B Surf Ant 0.5 ML SUSY Fluad 2017-18 78yr up(PF)45 mcg(15 mcgx3)/0.5 mL intramuscular syringe     Magnesium Oxide 400 MG CAPS magnesium 400 mg (as magnesium oxide) capsule  take 1 tab 3x  a week do not take with gabapentin at the same time     metoprolol (LOPRESSOR) 50 MG tablet      nitroGLYCERIN (NITROSTAT) 0.4 MG SL tablet      omeprazole (PRILOSEC) 40 MG capsule Take 40 mg by mouth daily.  0   pravastatin (PRAVACHOL) 20 MG tablet pravastatin 20 mg tablet  Take 1 tablet every day by oral route.     tamsulosin (FLOMAX) 0.4 MG CAPS capsule Take by mouth.     triamcinolone ointment (KENALOG) 0.5 % triamcinolone acetonide 0.5 % topical ointment     vitamin B-12 (CYANOCOBALAMIN) 500 MCG tablet Take by mouth.     Vitamin D, Ergocalciferol, (DRISDOL) 50000 UNITS CAPS capsule   0   Current Facility-Administered Medications on File Prior to Visit  Medication Dose Route Frequency Provider Last Rate Last Admin   triamcinolone acetonide (KENALOG) 10 MG/ML injection 10 mg  10 mg Other Once Landis Martins, DPM        Allergies  Allergen Reactions   Meperidine Hcl Nausea Only   Demerol [Meperidine] Nausea And Vomiting    Objective:  General: Alert and oriented x3 in no acute distress  Dermatology: Minimal reactive keratosis  sub met 1 and 5 bilateral. No open lesions bilateral lower extremities, no webspace macerations, no ecchymosis bilateral, all nails x 10 are are mildly elongated and dystrophic.  Vascular: Dorsalis Pedis and Posterior Tibial pedal pulses palpable, Capillary Fill Time 3 seconds,(+) pedal hair growth bilateral, temperature gradient within normal limits. Venous hyperpigmentation to shins bilateral.  1+ pitting edema bilateral.  Neurology: Gross sensation intact via light touch bilateral, subjective sharp pains to toes that have lessened with gabapentin.  Musculoskeletal: There is reproducible tenderness to palpation to both feet submet 5, prominent fifth  metatarsal heads bilateral right greater than left with cavovarus foot deformity.  Range of motion within normal limits except at midtarsal joint and at hammertoes. Strength within normal limits in all groups bilateral.   Assessment and Plan: Problem List Items Addressed This Visit   None Visit Diagnoses     Nail dystrophy    -  Primary   Callus of foot       Neuropathy       Cavus deformity of foot, acquired       Pain in both feet             -Complete examination performed -Re-Discussed treatement options and continued care for neuropathy secondary to radiculopathy and callus and nails -Mechanically debrided nails x10 using a sterile nipper without incident at no charge -At no additional charge mechanically debrided calluses x2 using a sterile chisel blade without incident -Continue with Gabapentin with adjustments as directed by neurologist -Recommend compression for venous skin changes and to wear consistently because he has pitting edema noted today's visit -Continue with intermittent leave to allow accommodations for possible flareups of pain; 1 day per month -Patient to return to office as needed or in 3 to 4 months for nail/callus care or sooner if condition worsens.   Landis Martins, DPM

## 2021-01-22 ENCOUNTER — Ambulatory Visit: Payer: Medicare HMO | Admitting: Sports Medicine

## 2021-02-22 DIAGNOSIS — R609 Edema, unspecified: Secondary | ICD-10-CM | POA: Insufficient documentation

## 2021-04-05 NOTE — Progress Notes (Incomplete)
[facility]  [address] [place],  [place]  [address] [phone]  Clinic Day:  04/09/2021  Referring physician: Shelle Iron, MD  This document serves as a record of services personally performed by Weston Settle, MD. It was created on their behalf by Curry,Lauren E, a trained medical scribe. The creation of this record is based on the scribe's personal observations and the provider's statements to them.  HISTORY OF PRESENT ILLNESS:  The patient is a 81 y.o. male with mild pancytopenia.  Extensive lab work done in the past did not reveal any obvious etiology behind his mildly low peripheral counts.  Of note, this gentleman did receive IV iron in May/June 2022 as labs showed a low ferritin level.  As his counts have not been dangerously low, his pancytopenia has been followed conservatively.  He comes in today to for routine followup.  Since his last visit, the patient has been doing fine.  He continues to have peripheral neuropathy, which is being managed by neurology and podiatry with gabapentin.   PHYSICAL EXAM:  There were no vitals taken for this visit. Wt Readings from Last 3 Encounters:  10/09/20 168 lb 3.2 oz (76.3 kg)  09/11/20 168 lb 14.4 oz (76.6 kg)  07/23/20 161 lb (73 kg)   There is no height or weight on file to calculate BMI. Performance status (ECOG): 0 - Asymptomatic Physical Exam Constitutional:      General: He is not in acute distress.    Appearance: Normal appearance. He is normal weight. He is not ill-appearing, toxic-appearing or diaphoretic.  HENT:     Head: Normocephalic and atraumatic.     Nose: Nose normal. No congestion or rhinorrhea.     Mouth/Throat:     Mouth: Mucous membranes are moist.     Pharynx: Oropharynx is clear. No oropharyngeal exudate or posterior oropharyngeal erythema.  Eyes:     General: No scleral icterus.       Right eye: No discharge.        Left eye: No discharge.     Extraocular  Movements: Extraocular movements intact.     Conjunctiva/sclera: Conjunctivae normal.     Pupils: Pupils are equal, round, and reactive to light.  Neck:     Vascular: No carotid bruit.  Cardiovascular:     Rate and Rhythm: Normal rate and regular rhythm.     Heart sounds: No murmur heard.   No friction rub. No gallop.  Pulmonary:     Effort: Pulmonary effort is normal. No respiratory distress.     Breath sounds: Normal breath sounds. No stridor. No wheezing, rhonchi or rales.  Chest:     Chest wall: No tenderness.  Abdominal:     General: Abdomen is flat. Bowel sounds are normal. There is no distension.     Palpations: Abdomen is soft. There is no mass.     Tenderness: There is no abdominal tenderness. There is no right CVA tenderness, left CVA tenderness, guarding or rebound.     Hernia: No hernia is present.  Musculoskeletal:        General: No swelling, tenderness, deformity or signs of injury. Normal range of motion.     Cervical back: Normal range of motion and neck supple. No rigidity or tenderness.     Right lower leg: No edema.     Left lower leg: No edema.  Lymphadenopathy:     Cervical: No cervical adenopathy.     Upper Body:  Right upper body: No supraclavicular or axillary adenopathy.     Left upper body: No supraclavicular or axillary adenopathy.     Lower Body: No right inguinal adenopathy. No left inguinal adenopathy.  Skin:    General: Skin is warm and dry.     Capillary Refill: Capillary refill takes less than 2 seconds.     Coloration: Skin is not jaundiced or pale.     Findings: No bruising, erythema, lesion or rash.  Neurological:     General: No focal deficit present.     Mental Status: He is alert and oriented to person, place, and time. Mental status is at baseline.     Cranial Nerves: No cranial nerve deficit.     Sensory: No sensory deficit.     Motor: No weakness.     Coordination: Coordination normal.     Gait: Gait normal.     Deep Tendon  Reflexes: Reflexes normal.  Psychiatric:        Mood and Affect: Mood normal.        Behavior: Behavior normal.        Thought Content: Thought content normal.        Judgment: Judgment normal.   LABS:   CBC Latest Ref Rng & Units 10/09/2020 09/11/2020 06/11/2020  WBC - 2.6 2.6 3.4  Hemoglobin 13.5 - 17.5 12.2(A) 12.5(A) 11.9(A)  Hematocrit 41 - 53 36(A) 37(A) 36(A)  Platelets 150 - 399 90(A) 83(A) 101(A)    ASSESSMENT & PLAN:  Assessment/Plan:  A 81 y.o. male with  mild pancytopenia.  When comparing his CBC today to what it was at his last visit, his peripheral counts remain essentially unchanged.  His hemoglobin remains essentially unchanged despite receiving IV iron 2 months ago.   Despite this, he is clinically doing well.  He understands I would not entertain a bone marrow biopsy unless there are rather abrupt changes in his counts that require immediate marrow evaluation.  I will see him back in 6 months for repeat clinical assessment.  The patient understands all the plans discussed today and is in agreement with them.     I, Rita Ohara, am acting as scribe for [name], MD    I have reviewed this report as typed by the medical scribe, and it is complete and accurate.  Dequincy Macarthur Critchley, MD

## 2021-04-09 ENCOUNTER — Other Ambulatory Visit: Payer: Medicare HMO

## 2021-04-09 ENCOUNTER — Ambulatory Visit: Payer: Medicare HMO | Admitting: Oncology

## 2021-04-12 ENCOUNTER — Other Ambulatory Visit: Payer: Medicare HMO

## 2021-04-12 ENCOUNTER — Ambulatory Visit: Payer: Medicare HMO | Admitting: Oncology

## 2021-04-19 NOTE — Progress Notes (Signed)
?Sean [name]  ?[address] ?[name],  [place]  [address] ?[phone] ? ?Clinic Day:  04/23/2021 ? ?Referring physician: Charlotte Sanes, MD ? ?This document serves as a record of services personally performed by Sean Potter, MD. It was created on their behalf by Curry,Lauren E, a trained medical scribe. The creation of this record is based on the scribe's personal observations and the provider's statements to them. ? ?HISTORY OF PRESENT ILLNESS:  ?The patient is a 81 y.o. male with mild pancytopenia.  Extensive lab work done in the past did not reveal any obvious etiology behind his mildly low peripheral counts.  Of note, this gentleman did receive IV iron in May/June 2022 as labs showed a low ferritin level.  Otherwise, as his counts have not been dangerously low, his pancytopenia has been followed conservatively.  He comes in today to for routine followup.  Since his last visit, the patient has been doing fine.  He denies having fatigue, bleeding issues or other findings which concern him for worsening pancytopenia.   ? ?PHYSICAL EXAM:  ?Blood pressure (!) 156/72, pulse 86, temperature 97.7 ?F (36.5 ?C), resp. rate 16, height '5\' 7"'$  (1.702 m), weight 165 lb 14.4 oz (75.3 kg), SpO2 100 %. ?Wt Readings from Last 3 Encounters:  ?04/23/21 165 lb 14.4 oz (75.3 kg)  ?10/09/20 168 lb 3.2 oz (76.3 kg)  ?09/11/20 168 lb 14.4 oz (76.6 kg)  ? ?Body mass index is 25.98 kg/m?Marland Kitchen ?Performance status (ECOG): 0 - Asymptomatic ?Physical Exam ?Constitutional:   ?   General: He is not in acute distress. ?   Appearance: Normal appearance. He is normal weight. He is not ill-appearing, toxic-appearing or diaphoretic.  ?HENT:  ?   Head: Normocephalic and atraumatic.  ?   Nose: Nose normal. No congestion or rhinorrhea.  ?   Mouth/Throat:  ?   Mouth: Mucous membranes are moist.  ?   Pharynx: Oropharynx is clear. No oropharyngeal exudate or posterior oropharyngeal erythema.  ?Eyes:  ?   General: No  scleral icterus.    ?   Right eye: No discharge.     ?   Left eye: No discharge.  ?   Extraocular Movements: Extraocular movements intact.  ?   Conjunctiva/sclera: Conjunctivae normal.  ?   Pupils: Pupils are equal, round, and reactive to light.  ?Neck:  ?   Vascular: No carotid bruit.  ?Cardiovascular:  ?   Rate and Rhythm: Normal rate and regular rhythm.  ?   Heart sounds: No murmur heard. ?  No friction rub. No gallop.  ?Pulmonary:  ?   Effort: Pulmonary effort is normal. No respiratory distress.  ?   Breath sounds: Normal breath sounds. No stridor. No wheezing, rhonchi or rales.  ?Chest:  ?   Chest wall: No tenderness.  ?Abdominal:  ?   General: Abdomen is flat. Bowel sounds are normal. There is no distension.  ?   Palpations: Abdomen is soft. There is no mass.  ?   Tenderness: There is no abdominal tenderness. There is no right CVA tenderness, left CVA tenderness, guarding or rebound.  ?   Hernia: No hernia is present.  ?Musculoskeletal:     ?   General: No swelling, tenderness, deformity or signs of injury. Normal range of motion.  ?   Cervical back: Normal range of motion and neck supple. No rigidity or tenderness.  ?   Right lower leg: No edema.  ?   Left lower leg:  No edema.  ?Lymphadenopathy:  ?   Cervical: No cervical adenopathy.  ?   Upper Body:  ?   Right upper body: No supraclavicular or axillary adenopathy.  ?   Left upper body: No supraclavicular or axillary adenopathy.  ?   Lower Body: No right inguinal adenopathy. No left inguinal adenopathy.  ?Skin: ?   General: Skin is warm and dry.  ?   Capillary Refill: Capillary refill takes less than 2 seconds.  ?   Coloration: Skin is not jaundiced or pale.  ?   Findings: No bruising, erythema, lesion or rash.  ?Neurological:  ?   General: No focal deficit present.  ?   Mental Status: He is alert and oriented to person, place, and time. Mental status is at baseline.  ?   Cranial Nerves: No cranial nerve deficit.  ?   Sensory: No sensory deficit.  ?   Motor:  No weakness.  ?   Coordination: Coordination normal.  ?   Gait: Gait normal.  ?   Deep Tendon Reflexes: Reflexes normal.  ?Psychiatric:     ?   Mood and Affect: Mood normal.     ?   Behavior: Behavior normal.     ?   Thought Content: Thought content normal.     ?   Judgment: Judgment normal.  ? ?LABS:  ? ?CBC Latest Ref Rng & Units 04/23/2021 10/09/2020 09/11/2020  ?WBC - 2.8 2.6 2.6  ?Hemoglobin 13.5 - 17.5 11.6(A) 12.2(A) 12.5(A)  ?Hematocrit 41 - 53 35(A) 36(A) 37(A)  ?Platelets 150 - 400 K/uL 97(A) 90(A) 83(A)  ? ? Latest Reference Range & Units 04/23/21 14:07  ?Iron 45 - 182 ug/dL 49  ?UIBC ug/dL 290  ?TIBC 250 - 450 ug/dL 339  ?Saturation Ratios 17.9 - 39.5 % 14 (L)  ?Ferritin 24 - 336 ng/mL 24  ?Folate >5.9 ng/mL 17.8  ?Vitamin B12 180 - 914 pg/mL 2,136 (H)  ?(L): Data is abnormally low ?(H): Data is abnormally high ? ?ASSESSMENT & PLAN:  ?Assessment/Plan:  A 81 y.o. male with  mild pancytopenia.  When comparing his CBC today to what it was at his last visit, his hemoglobin in slightly lower than what it was previously.  As some of his iron parameters remain on the low end of normal, I will arrange for him to receive another course of IV iron over these next few weeks to replenish his iron stores, which hopefully lead to an increase in his hemoglobin.  Otherwise, he clinically appears to be doing well.  I will see him back in 6 months for repeat clinical assessment.  The patient understands all the plans discussed today and is in agreement with them.   ? ?I, Sean [name], am acting as scribe for Sean Potter, MD   ? ?I have reviewed this report as typed by the medical scribe, and it is complete and accurate. ? ?Sean Wynns Macarthur Critchley, MD ? ? ? ?  ?

## 2021-04-20 ENCOUNTER — Other Ambulatory Visit: Payer: Self-pay

## 2021-04-20 ENCOUNTER — Encounter: Payer: Self-pay | Admitting: Sports Medicine

## 2021-04-20 ENCOUNTER — Ambulatory Visit: Payer: Medicare HMO | Admitting: Sports Medicine

## 2021-04-20 DIAGNOSIS — M216X9 Other acquired deformities of unspecified foot: Secondary | ICD-10-CM

## 2021-04-20 DIAGNOSIS — L603 Nail dystrophy: Secondary | ICD-10-CM

## 2021-04-20 DIAGNOSIS — M79672 Pain in left foot: Secondary | ICD-10-CM

## 2021-04-20 DIAGNOSIS — L84 Corns and callosities: Secondary | ICD-10-CM

## 2021-04-20 DIAGNOSIS — M79671 Pain in right foot: Secondary | ICD-10-CM

## 2021-04-20 DIAGNOSIS — G629 Polyneuropathy, unspecified: Secondary | ICD-10-CM

## 2021-04-20 NOTE — Progress Notes (Signed)
Subjective: ?Sean [name] is a 81 y.o. male patient who returns to office for follow up evaluation and nail care.  Patient reports that his feet are about the same having breaks at work and wearing the [employer] have helped.  Patient also reports that he had an episode last week where he got sick and was diagnosed with gastroenteritis and states that he has been out of work for the last 2 weeks and he goes back again on tomorrow.  Patient denies any other pedal complaints at this time. ? ?Patient Active Problem List  ? Diagnosis Date Noted  ? Swelling 02/22/2021  ? Other pancytopenia ([place]) 10/09/2020  ? Iron deficiency anemia 06/12/2020  ? 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 Cerebral infarction ([place]) 11/17/2017  ? Chest discomfort 11/01/2017  ? H/O: CVA (cerebrovascular accident) 11/01/2017  ? CAD in native artery 12/23/2016  ? Current use of long term anticoagulation 12/23/2016  ? H/O heart artery stent 12/23/2016  ? Malaise and fatigue 12/23/2016  ? PAF (paroxysmal atrial fibrillation) ([place]) 12/23/2016  ? 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 ?Current Outpatient Medications on File Prior to Visit  ?Medication Sig Dispense Refill  ? furosemide (LASIX) 20 MG tablet furosemide 20 mg tablet ? TAKE 1 TABLET BY MOUTH TWICE DAILY AS NEEDED FOR SWELLING    ? acetaminophen (TYLENOL) 325 MG tablet Take by mouth.    ? AFLURIA PRESERVATIVE FREE injection     ? amoxicillin (AMOXIL) 500 MG capsule amoxicillin 500 mg capsule    ? aspirin 81 MG chewable tablet Chew 81 mg by mouth.    ? cetirizine (ZYRTEC) 10 MG tablet TK 1 T PO D PRF COG    ? Cholecalciferol 125 MCG (5000 UT) TABS cholecalciferol (vitamin D3) 125 mcg (5,000 unit) tablet ?  5000 units by oral route.    ? clotrimazole-betamethasone (LOTRISONE) cream clotrimazole-betamethasone 1 %-0.05 % topical cream ? APPLY TOPICALLY TO THE AFFECTED AND SURROUNDING AREAS TWICE DAILY IN THE MORNING AND IN THE EVENING AS NEEDED    ? dutasteride  (AVODART) 0.5 MG capsule     ? enoxaparin (LOVENOX) 80 MG/0.8ML injection INJECT 0.8 MILLILITER AS DIRECTED TWICE DAILY ON SCHEDULE GIVEN BY DOCTORS OFFICE.    ? gabapentin (NEURONTIN) 300 MG capsule Take 1 capsule (300 mg total) by mouth 3 (three) times daily. 1 capsule at lunchtime and 2 at bedtime 90 capsule 3  ? Influenza Vac Split High-Dose 0.5 ML SUSY     ? Influenza Vac Typ A&B Surf Ant 0.5 ML SUSY Fluad 2017-18 72yr up(PF)45 mcg(15 mcgx3)/0.5 mL intramuscular syringe    ? loperamide (IMODIUM A-D) 2 MG tablet Imodium A-D 2 mg tablet ? 1 tab after each bowell movement  no more than 4 in 24 hours    ? Magnesium Oxide 400 MG CAPS magnesium 400 mg (as magnesium oxide) capsule ? take 1 tab 3x  a week do not take with gabapentin at the same time    ? metoprolol (LOPRESSOR) 50 MG tablet     ? nitroGLYCERIN (NITROSTAT) 0.4 MG SL tablet     ? nystatin cream (MYCOSTATIN) nystatin 100,000 unit/gram topical cream ? APPLY TOPICALLY TO THE AFFECTED AREA TWICE DAILY    ? omeprazole (PRILOSEC) 40 MG capsule Take 40 mg by mouth daily.  0  ? pravastatin (PRAVACHOL) 20 MG tablet pravastatin 20 mg tablet ? Take 1 tablet every day by oral route.    ? tamsulosin (FLOMAX) 0.4 MG CAPS capsule Take  by mouth.    ? triamcinolone ointment (KENALOG) 0.5 % triamcinolone acetonide 0.5 % topical ointment    ? vitamin B-12 (CYANOCOBALAMIN) 500 MCG tablet Take by mouth.    ? Vitamin D, Ergocalciferol, (DRISDOL) 50000 UNITS CAPS capsule   0  ? warfarin (COUMADIN) 2 MG tablet Take 8 mg by mouth daily.    ? zinc gluconate 50 MG tablet Take 1 tablet by mouth daily.    ? ?Current Facility-Administered Medications on File Prior to Visit  ?Medication Dose Route Frequency Provider Last Rate Last Admin  ? triamcinolone acetonide (KENALOG) 10 MG/ML injection 10 mg  10 mg Other Once Landis Martins, DPM      ? ? ?Allergies  ?Allergen Reactions  ? Meperidine Hcl Nausea Only  ? Demerol [Meperidine] Nausea And Vomiting  ? ? ?Objective:  ?General: Alert and  oriented x3 in no acute distress ? ?Dermatology: Minimal reactive keratosis sub met 1 and 5 bilateral. No open lesions bilateral lower extremities, no webspace macerations, no ecchymosis bilateral, all nails x 10 are are mildly elongated and dystrophic. ? ?Vascular: Dorsalis Pedis and Posterior Tibial pedal pulses palpable, Capillary Fill Time 3 seconds,(+) pedal hair growth bilateral, temperature gradient within normal limits. Venous hyperpigmentation to shins bilateral.  1+ pitting edema bilateral. ? ?Neurology: Gross sensation intact via light touch bilateral, subjective sharp pains to toes that have lessened with gabapentin as previously noted. ? ?Musculoskeletal: There is reproducible tenderness to palpation to both feet submet 5, prominent fifth metatarsal heads bilateral right greater than left with cavovarus foot deformity.  Range of motion within normal limits except at midtarsal joint and at hammertoes. Strength within normal limits in all groups bilateral.  ? ?Assessment and Plan: ?Problem List Items Addressed This Visit   ?None ?Visit Diagnoses   ? ? Nail dystrophy    -  Primary  ? Callus of foot      ? Neuropathy      ? Cavus deformity of foot, acquired      ? Pain in both feet      ? ?  ? ?-Complete examination performed ?-Re-Discussed treatement options and continued care for neuropathy secondary to radiculopathy and callus and nails ?-Mechanically debrided nails x10 using a sterile nipper without incident at no charge ?-At no additional charge mechanically debrided calluses x2 using a sterile chisel blade without incident ?-Continue with Gabapentin with adjustments as directed by neurologist as previously noted ?-Recommend compression for venous skin changes and to wear consistently because he has pitting edema noted today's visit ?-Continue with intermittent leave to allow accommodations for possible flareups of pain; 1 day per month; updated work note provided ?-Patient to return to office as  needed or in 3 to 4 months for nail/callus care or sooner if condition worsens.  ? ?Landis Martins, DPM ? ? ?

## 2021-04-23 ENCOUNTER — Inpatient Hospital Stay: Payer: Medicare HMO | Admitting: Oncology

## 2021-04-23 ENCOUNTER — Encounter: Payer: Self-pay | Admitting: Oncology

## 2021-04-23 ENCOUNTER — Telehealth: Payer: Self-pay | Admitting: Oncology

## 2021-04-23 ENCOUNTER — Inpatient Hospital Stay: Payer: Medicare HMO | Attending: Oncology

## 2021-04-23 ENCOUNTER — Other Ambulatory Visit: Payer: Self-pay

## 2021-04-23 ENCOUNTER — Other Ambulatory Visit: Payer: Self-pay | Admitting: Oncology

## 2021-04-23 VITALS — BP 156/72 | HR 86 | Temp 97.7°F | Resp 16 | Ht 67.0 in | Wt 165.9 lb

## 2021-04-23 DIAGNOSIS — D649 Anemia, unspecified: Secondary | ICD-10-CM | POA: Diagnosis not present

## 2021-04-23 DIAGNOSIS — D508 Other iron deficiency anemias: Secondary | ICD-10-CM | POA: Diagnosis not present

## 2021-04-23 DIAGNOSIS — D61818 Other pancytopenia: Secondary | ICD-10-CM

## 2021-04-23 DIAGNOSIS — R002 Palpitations: Secondary | ICD-10-CM | POA: Insufficient documentation

## 2021-04-23 LAB — BASIC METABOLIC PANEL
BUN: 15 (ref 4–21)
CO2: 34 — AB (ref 13–22)
Chloride: 109 — AB (ref 99–108)
Creatinine: 0.7 (ref 0.6–1.3)
Glucose: 89
Potassium: 3.9 mEq/L (ref 3.5–5.1)
Sodium: 142 (ref 137–147)

## 2021-04-23 LAB — CBC AND DIFFERENTIAL
HCT: 35 — AB (ref 41–53)
Hemoglobin: 11.6 — AB (ref 13.5–17.5)
Neutrophils Absolute: 1.9
Platelets: 97 10*3/uL — AB (ref 150–400)
WBC: 2.8

## 2021-04-23 LAB — IRON AND TIBC
Iron: 49 ug/dL (ref 45–182)
Saturation Ratios: 14 % — ABNORMAL LOW (ref 17.9–39.5)
TIBC: 339 ug/dL (ref 250–450)
UIBC: 290 ug/dL

## 2021-04-23 LAB — HEPATIC FUNCTION PANEL
ALT: 59 U/L — AB (ref 10–40)
AST: 61 — AB (ref 14–40)
Alkaline Phosphatase: 124 (ref 25–125)
Bilirubin, Total: 0.5

## 2021-04-23 LAB — FERRITIN: Ferritin: 24 ng/mL (ref 24–336)

## 2021-04-23 LAB — COMPREHENSIVE METABOLIC PANEL
Albumin: 3.4 — AB (ref 3.5–5.0)
Calcium: 9.3 (ref 8.7–10.7)

## 2021-04-23 LAB — CBC: RBC: 3.8 — AB (ref 3.87–5.11)

## 2021-04-23 LAB — VITAMIN B12: Vitamin B-12: 2136 pg/mL — ABNORMAL HIGH (ref 180–914)

## 2021-04-23 LAB — FOLATE: Folate: 17.8 ng/mL (ref >5.9)

## 2021-04-23 NOTE — Telephone Encounter (Signed)
Per 04/23/21 los next appt scheduled and confirmed with patient ?

## 2021-04-25 ENCOUNTER — Encounter: Payer: Self-pay | Admitting: Oncology

## 2021-05-06 ENCOUNTER — Encounter: Payer: Self-pay | Admitting: Oncology

## 2021-05-14 ENCOUNTER — Other Ambulatory Visit: Payer: Self-pay | Admitting: Pharmacist

## 2021-05-17 ENCOUNTER — Inpatient Hospital Stay: Payer: Medicare HMO | Attending: Oncology

## 2021-05-17 ENCOUNTER — Other Ambulatory Visit: Payer: Self-pay | Admitting: Sports Medicine

## 2021-05-17 VITALS — BP 122/55 | HR 67 | Temp 98.3°F | Resp 20 | Wt 171.0 lb

## 2021-05-17 DIAGNOSIS — M79671 Pain in right foot: Secondary | ICD-10-CM

## 2021-05-17 DIAGNOSIS — D508 Other iron deficiency anemias: Secondary | ICD-10-CM

## 2021-05-17 DIAGNOSIS — Z79899 Other long term (current) drug therapy: Secondary | ICD-10-CM | POA: Insufficient documentation

## 2021-05-17 DIAGNOSIS — D61818 Other pancytopenia: Secondary | ICD-10-CM | POA: Diagnosis present

## 2021-05-17 DIAGNOSIS — G629 Polyneuropathy, unspecified: Secondary | ICD-10-CM

## 2021-05-17 MED ORDER — SODIUM CHLORIDE 0.9 % IV SOLN: Freq: Once | INTRAVENOUS | Status: AC

## 2021-05-17 MED ORDER — SODIUM CHLORIDE 0.9 % IV SOLN
200.0000 mg | Freq: Once | INTRAVENOUS | Status: AC
  Administered 2021-05-17: 200 mg via INTRAVENOUS
  Filled 2021-05-17: qty 200

## 2021-05-17 NOTE — Patient Instructions (Signed)

## 2021-05-18 ENCOUNTER — Inpatient Hospital Stay: Payer: Medicare HMO

## 2021-05-18 VITALS — BP 127/60 | HR 69 | Resp 20 | Wt 169.1 lb

## 2021-05-18 DIAGNOSIS — D61818 Other pancytopenia: Secondary | ICD-10-CM | POA: Diagnosis not present

## 2021-05-18 DIAGNOSIS — D508 Other iron deficiency anemias: Secondary | ICD-10-CM

## 2021-05-18 MED ORDER — SODIUM CHLORIDE 0.9 % IV SOLN: Freq: Once | INTRAVENOUS | Status: AC

## 2021-05-18 MED ORDER — SODIUM CHLORIDE 0.9 % IV SOLN
200.0000 mg | Freq: Once | INTRAVENOUS | Status: AC
  Administered 2021-05-18: 200 mg via INTRAVENOUS
  Filled 2021-05-18: qty 200

## 2021-05-18 NOTE — Patient Instructions (Signed)

## 2021-05-19 NOTE — Telephone Encounter (Signed)
Please advise 

## 2021-05-21 ENCOUNTER — Inpatient Hospital Stay: Payer: Medicare HMO

## 2021-05-21 VITALS — BP 137/59 | HR 67 | Temp 98.1°F | Resp 18 | Wt 169.0 lb

## 2021-05-21 DIAGNOSIS — D61818 Other pancytopenia: Secondary | ICD-10-CM | POA: Diagnosis not present

## 2021-05-21 DIAGNOSIS — D508 Other iron deficiency anemias: Secondary | ICD-10-CM

## 2021-05-21 MED ORDER — SODIUM CHLORIDE 0.9 % IV SOLN
200.0000 mg | Freq: Once | INTRAVENOUS | Status: AC
  Administered 2021-05-21: 200 mg via INTRAVENOUS
  Filled 2021-05-21: qty 200

## 2021-05-21 MED ORDER — SODIUM CHLORIDE 0.9 % IV SOLN: Freq: Once | INTRAVENOUS | Status: AC

## 2021-05-21 NOTE — Patient Instructions (Signed)

## 2021-05-26 ENCOUNTER — Inpatient Hospital Stay: Payer: Medicare HMO

## 2021-05-26 VITALS — BP 130/59 | HR 70 | Temp 98.5°F | Resp 18 | Wt 171.0 lb

## 2021-05-26 DIAGNOSIS — D508 Other iron deficiency anemias: Secondary | ICD-10-CM

## 2021-05-26 DIAGNOSIS — D61818 Other pancytopenia: Secondary | ICD-10-CM | POA: Diagnosis not present

## 2021-05-26 MED ORDER — SODIUM CHLORIDE 0.9 % IV SOLN
200.0000 mg | Freq: Once | INTRAVENOUS | Status: AC
  Administered 2021-05-26: 200 mg via INTRAVENOUS
  Filled 2021-05-26: qty 200

## 2021-05-26 MED ORDER — SODIUM CHLORIDE 0.9 % IV SOLN: Freq: Once | INTRAVENOUS | Status: AC

## 2021-05-27 ENCOUNTER — Inpatient Hospital Stay: Payer: Medicare HMO

## 2021-05-27 VITALS — BP 119/59 | HR 73 | Temp 98.6°F | Resp 18 | Wt 171.0 lb

## 2021-05-27 DIAGNOSIS — D61818 Other pancytopenia: Secondary | ICD-10-CM | POA: Diagnosis not present

## 2021-05-27 DIAGNOSIS — D508 Other iron deficiency anemias: Secondary | ICD-10-CM

## 2021-05-27 MED ORDER — SODIUM CHLORIDE 0.9 % IV SOLN: Freq: Once | INTRAVENOUS | Status: AC

## 2021-05-27 MED ORDER — SODIUM CHLORIDE 0.9 % IV SOLN
200.0000 mg | Freq: Once | INTRAVENOUS | Status: AC
  Administered 2021-05-27: 200 mg via INTRAVENOUS
  Filled 2021-05-27: qty 200

## 2021-05-27 NOTE — Patient Instructions (Signed)

## 2021-05-27 NOTE — Progress Notes (Signed)
1225:PT STABLE AT TIME OF DISCHARGE ? ?

## 2021-07-13 DIAGNOSIS — I4892 Unspecified atrial flutter: Secondary | ICD-10-CM | POA: Insufficient documentation

## 2021-07-22 ENCOUNTER — Ambulatory Visit: Payer: Medicare HMO | Admitting: Podiatry

## 2021-07-22 ENCOUNTER — Encounter: Payer: Self-pay | Admitting: Podiatry

## 2021-07-22 DIAGNOSIS — L84 Corns and callosities: Secondary | ICD-10-CM | POA: Diagnosis not present

## 2021-07-22 DIAGNOSIS — G629 Polyneuropathy, unspecified: Secondary | ICD-10-CM

## 2021-07-22 DIAGNOSIS — M79674 Pain in right toe(s): Secondary | ICD-10-CM

## 2021-07-22 DIAGNOSIS — M79675 Pain in left toe(s): Secondary | ICD-10-CM

## 2021-07-22 DIAGNOSIS — B351 Tinea unguium: Secondary | ICD-10-CM

## 2021-07-23 ENCOUNTER — Encounter: Payer: Self-pay | Admitting: Podiatry

## 2021-07-26 NOTE — Progress Notes (Signed)
  Subjective:  Patient ID: Sean [name], male    DOB: 1941/02/05,  MRN: [id number]  Sean [name] presents to clinic today for at risk foot care with history of peripheral neuropathy and corn(s) L 5th toe, callus(es) b/l lower extremities and painful mycotic nails.  Pain interferes with ambulation. Aggravating factors include wearing enclosed shoe gear. Painful toenails interfere with ambulation. Aggravating factors include wearing enclosed shoe gear. Pain is relieved with periodic professional debridement. Painful corns and calluses are aggravated when weightbearing with and without shoegear. Pain is relieved with periodic professional debridement.  New problem(s): None.   Patient is requesting a new letter for work for decreased walking/standing on his shift.  PCP is Charlotte Sanes, MD , and last visit was Jul 08, 2021.  Allergies  Allergen Reactions   Meperidine Hcl Nausea Only   Demerol [Meperidine] Nausea And Vomiting    Review of Systems: Negative except as noted in the HPI.  Objective: No changes noted in today's physical examination. Objective:   Vascular Examination: Vascular status intact b/l with palpable pedal pulses. Pedal hair present b/l. CFT immediate b/l. No pain with calf compression b/l. Skin temperature gradient WNL b/l. Trace edema noted BLE.  Neurological Examination: Sensation grossly intact b/l with 10 gram monofilament. Vibratory sensation intact b/l. Pt has subjective symptoms of neuropathy.  Dermatological Examination: Pedal skin with normal turgor, texture and tone b/l. Toenails 1-5 b/l thick, discolored, elongated with subungual debris and pain on dorsal palpation. Hyperkeratotic lesion(s) L 5th toe and submet head 5 b/l.  No erythema, no edema, no drainage, no fluctuance.  Musculoskeletal Examination: Muscle strength 5/5 to b/l LE. Pes cavus deformity noted b/l lower extremities.  Radiographs: None  Assessment/Plan: 1. Pain due to onychomycosis of  toenails of both feet   2. Callus of foot   3. Neuropathy      -Examined patient. -Will provide work letter for patient for decreased wealking/standing. -Mycotic toenails 1-5 bilaterally were debrided in length and girth with sterile nail nippers and dremel without incident. -Callus(es) L 5th toe and submet head 5 b/l pared utilizing sterile scalpel blade without complication or incident. Total number debrided =3. -Patient/POA to call should there be question/concern in the interim.   Return in about 3 months (around 10/22/2021).  Marzetta Board, DPM

## 2021-08-05 ENCOUNTER — Ambulatory Visit: Payer: Medicare HMO | Admitting: Podiatry

## 2021-10-26 NOTE — Progress Notes (Signed)
[place]  [address] [place],  [place]  [address] [phone]  Clinic Day:  10/27/2021  Referring physician: Charlotte Sanes, MD  HISTORY OF PRESENT ILLNESS:  The patient is a 81 y.o. male with mild pancytopenia.  Extensive lab work done in the past did not reveal any obvious etiology behind his mildly low peripheral counts.  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  He comes in today to reassess his peripheral counts.  Overall, the patient claims to be doing well.  He does complain of occasional fatigue, but denies having other particular changes in his health.  PHYSICAL EXAM:  Blood pressure 122/69, pulse 65, temperature (!) 97.5 F (36.4 C), resp. rate 16, height '5\' 7"'$  (1.702 m), weight 172 lb 11.2 oz (78.3 kg), SpO2 96 %. Wt Readings from Last 3 Encounters:  10/27/21 172 lb 11.2 oz (78.3 kg)  05/27/21 171 lb (77.6 kg)  05/26/21 171 lb (77.6 kg)   Body mass index is 27.05 kg/m. Performance status (ECOG): 0 - Asymptomatic Physical Exam Constitutional:      General: He is not in acute distress.    Appearance: Normal appearance. He is normal weight. He is not ill-appearing, toxic-appearing or diaphoretic.  HENT:     Head: Normocephalic and atraumatic.     Nose: Nose normal. No congestion or rhinorrhea.     Mouth/Throat:     Mouth: Mucous membranes are moist.     Pharynx: Oropharynx is clear. No oropharyngeal exudate or posterior oropharyngeal erythema.  Eyes:     General: No scleral icterus.       Right eye: No discharge.        Left eye: No discharge.     Extraocular Movements: Extraocular movements intact.     Conjunctiva/sclera: Conjunctivae normal.     Pupils: Pupils are equal, round, and reactive to light.  Neck:     Vascular: No carotid bruit.  Cardiovascular:     Rate and Rhythm: Normal  rate and regular rhythm.     Heart sounds: No murmur heard.    No friction rub. No gallop.  Pulmonary:     Effort: Pulmonary effort is normal. No respiratory distress.     Breath sounds: Normal breath sounds. No stridor. No wheezing, rhonchi or rales.  Chest:     Chest wall: No tenderness.  Abdominal:     General: Abdomen is flat. Bowel sounds are normal. There is no distension.     Palpations: Abdomen is soft. There is no mass.     Tenderness: There is no abdominal tenderness. There is no right CVA tenderness, left CVA tenderness, guarding or rebound.     Hernia: No hernia is present.  Musculoskeletal:        General: No swelling, tenderness, deformity or signs of injury. Normal range of motion.     Cervical back: Normal range of motion and neck supple. No rigidity or tenderness.     Right lower leg: No edema.     Left lower leg: No edema.  Lymphadenopathy:     Cervical: No cervical adenopathy.     Upper Body:     Right upper body: No supraclavicular or axillary adenopathy.     Left upper body: No supraclavicular or axillary adenopathy.     Lower Body: No right inguinal adenopathy. No left inguinal adenopathy.  Skin:  General: Skin is warm and dry.     Capillary Refill: Capillary refill takes less than 2 seconds.     Coloration: Skin is not jaundiced or pale.     Findings: No bruising, erythema, lesion or rash.  Neurological:     General: No focal deficit present.     Mental Status: He is alert and oriented to person, place, and time. Mental status is at baseline.     Cranial Nerves: No cranial nerve deficit.     Sensory: No sensory deficit.     Motor: No weakness.     Coordination: Coordination normal.     Gait: Gait normal.     Deep Tendon Reflexes: Reflexes normal.  Psychiatric:        Mood and Affect: Mood normal.        Behavior: Behavior normal.        Thought Content: Thought content normal.        Judgment: Judgment normal.    LABS:      Latest Ref Rng &  Units 10/27/2021   12:00 AM 04/23/2021   12:00 AM 10/09/2020   12:00 AM  CBC  WBC  2.5     2.8  2.6   Hemoglobin 13.5 - 17.5 11.3     11.6  12.2   Hematocrit 41 - 53 34     35  36   Platelets 150 - 400 K/uL 82     97  90      This result is from an external source.    Latest Reference Range & Units 10/27/21 00:00  Sodium 137 - 147  139 (E)  Potassium 3.5 - 5.1 mEq/L 3.7 (E)  Chloride 99 - 108  109 ! (E)  CO2 13 - 22  27 ! (E)  Glucose  105 (E)  BUN 4 - 21  17 (E)  Creatinine 0.6 - 1.3  0.6 (E)  Calcium 8.7 - 10.7  8.7 (E)  Alkaline Phosphatase 25 - 125  115 (E)  Albumin 3.5 - 5.0  3.6 (E)  AST 14 - 40  42 ! (E)  ALT 10 - 40 U/L 37 (E)  Bilirubin, Total  0.7 (E)  !: Data is abnormal (E): External lab result  Latest Reference Range & Units 10/27/21 10:14  Iron 45 - 182 ug/dL 60  UIBC ug/dL 249  TIBC 250 - 450 ug/dL 309  Saturation Ratios 17.9 - 39.5 % 19  Ferritin 24 - 336 ng/mL 44   ASSESSMENT & PLAN:  Assessment/Plan:  A 81 y.o. male with mild pancytopenia.  Overall, his labs are not much different today than what they have been in the past.  Based upon recent studies, it clearly appears that his mild pancytopenia is due to his underlying cirrhosis and secondary splenomegaly.  The patient understands his counts will likely be chronically low due to this.  However, I do not anticipate his cirrhosis and secondary splenomegaly to cause severe cytopenias to where any intervention will be necessary.  As he is clinically doing well, I will see him back in 1 year for repeat clinical assessment.  The patient understands all the plans discussed today and is in agreement with them.    Leshay Desaulniers Macarthur Critchley, MD

## 2021-10-27 ENCOUNTER — Other Ambulatory Visit: Payer: Self-pay | Admitting: Oncology

## 2021-10-27 ENCOUNTER — Inpatient Hospital Stay: Payer: Medicare HMO

## 2021-10-27 ENCOUNTER — Inpatient Hospital Stay: Payer: Medicare HMO | Attending: Oncology | Admitting: Oncology

## 2021-10-27 ENCOUNTER — Telehealth: Payer: Self-pay | Admitting: Oncology

## 2021-10-27 VITALS — BP 122/69 | HR 65 | Temp 97.5°F | Resp 16 | Ht 67.0 in | Wt 172.7 lb

## 2021-10-27 DIAGNOSIS — D696 Thrombocytopenia, unspecified: Secondary | ICD-10-CM | POA: Insufficient documentation

## 2021-10-27 DIAGNOSIS — D61818 Other pancytopenia: Secondary | ICD-10-CM | POA: Insufficient documentation

## 2021-10-27 DIAGNOSIS — R5383 Other fatigue: Secondary | ICD-10-CM | POA: Insufficient documentation

## 2021-10-27 DIAGNOSIS — D649 Anemia, unspecified: Secondary | ICD-10-CM | POA: Diagnosis present

## 2021-10-27 DIAGNOSIS — K7460 Unspecified cirrhosis of liver: Secondary | ICD-10-CM | POA: Diagnosis not present

## 2021-10-27 DIAGNOSIS — R591 Generalized enlarged lymph nodes: Secondary | ICD-10-CM | POA: Insufficient documentation

## 2021-10-27 DIAGNOSIS — D709 Neutropenia, unspecified: Secondary | ICD-10-CM | POA: Diagnosis not present

## 2021-10-27 DIAGNOSIS — D508 Other iron deficiency anemias: Secondary | ICD-10-CM

## 2021-10-27 DIAGNOSIS — R161 Splenomegaly, not elsewhere classified: Secondary | ICD-10-CM | POA: Diagnosis not present

## 2021-10-27 LAB — BASIC METABOLIC PANEL
BUN: 17 (ref 4–21)
CO2: 27 — AB (ref 13–22)
Chloride: 109 — AB (ref 99–108)
Creatinine: 0.6 (ref 0.6–1.3)
Glucose: 105
Potassium: 3.7 mEq/L (ref 3.5–5.1)
Sodium: 139 (ref 137–147)

## 2021-10-27 LAB — FERRITIN: Ferritin: 44 ng/mL (ref 24–336)

## 2021-10-27 LAB — CBC AND DIFFERENTIAL
HCT: 34 — AB (ref 41–53)
Hemoglobin: 11.3 — AB (ref 13.5–17.5)
Neutrophils Absolute: 1.73
Platelets: 82 10*3/uL — AB (ref 150–400)
WBC: 2.5

## 2021-10-27 LAB — HEPATIC FUNCTION PANEL
ALT: 37 U/L (ref 10–40)
AST: 42 — AB (ref 14–40)
Alkaline Phosphatase: 115 (ref 25–125)
Bilirubin, Total: 0.7

## 2021-10-27 LAB — COMPREHENSIVE METABOLIC PANEL
Albumin: 3.6 (ref 3.5–5.0)
Calcium: 8.7 (ref 8.7–10.7)

## 2021-10-27 LAB — IRON AND TIBC
Iron: 60 ug/dL (ref 45–182)
Saturation Ratios: 19 % (ref 17.9–39.5)
TIBC: 309 ug/dL (ref 250–450)
UIBC: 249 ug/dL

## 2021-10-27 LAB — VITAMIN B12: Vitamin B-12: 1549 pg/mL — ABNORMAL HIGH (ref 180–914)

## 2021-10-27 LAB — CBC: RBC: 3.53 — AB (ref 3.87–5.11)

## 2021-10-27 LAB — FOLATE: Folate: 17.2 ng/mL (ref >5.9)

## 2021-10-27 NOTE — Telephone Encounter (Signed)
Patient has been scheduled for follow-up visit per 10/27/21 los. Pt given an appt calendar with date and time.  

## 2021-11-15 ENCOUNTER — Ambulatory Visit: Payer: Medicare HMO | Admitting: Podiatry

## 2021-11-15 DIAGNOSIS — M79674 Pain in right toe(s): Secondary | ICD-10-CM | POA: Diagnosis not present

## 2021-11-15 DIAGNOSIS — M79675 Pain in left toe(s): Secondary | ICD-10-CM | POA: Diagnosis not present

## 2021-11-15 DIAGNOSIS — B351 Tinea unguium: Secondary | ICD-10-CM

## 2021-11-15 DIAGNOSIS — G629 Polyneuropathy, unspecified: Secondary | ICD-10-CM | POA: Diagnosis not present

## 2021-11-15 DIAGNOSIS — L84 Corns and callosities: Secondary | ICD-10-CM

## 2021-11-15 DIAGNOSIS — L603 Nail dystrophy: Secondary | ICD-10-CM

## 2021-11-15 MED ORDER — GABAPENTIN 300 MG PO CAPS: 300.0000 mg | ORAL_CAPSULE | Freq: Three times a day (TID) | ORAL | 3 refills | Status: DC

## 2021-11-15 NOTE — Progress Notes (Signed)
Subjective:  Patient ID: Sean [name], male    DOB: Dec 05, 1940,  MRN: [id number]  Chief Complaint  Patient presents with   Nail Problem    RFC-Trim and callus removal   Numbness    Patient needs a refill on Gabapentin. Patient stated he still has numbness and pain. He only takes 2 capsules at bedtime.     81 y.o. male presents with the above complaint. History confirmed with patient. history of peripheral neuropathy and corn(s) L 5th toe, callus(es) b/l lower extremities and painful mycotic nails.  Pain interferes with ambulation. Aggravating factors include wearing enclosed shoe gear. Painful toenails interfere with ambulation. Aggravating factors include wearing enclosed shoe gear. Pain is relieved with periodic professional debridement. Painful corns and calluses are aggravated when weightbearing with and without shoegear. Pain is relieved with periodic professional debridement.  Objective:  Physical Exam: warm, good capillary refill, nail exam onychomycosis of the toenails, onycholysis, and dystrophic nails, callus present DP pulses palpable, PT pulses palpable, and protective sensation absent Left Foot: normal exam, no swelling, tenderness, instability; ligaments intact, full range of motion of all ankle/foot joints  Right Foot: normal exam, no swelling, tenderness, instability; ligaments intact, full range of motion of all ankle/foot joints   No images are attached to the encounter.  Assessment:   1. Pain due to onychomycosis of toenails of both feet   2. Callus of foot   3. Neuropathy   4. Nail dystrophy      Plan:  Patient was evaluated and treated and all questions answered.  #Painful hyperkeratotic lesion located lateral aspect of the left fifth toe distal interphalangeal joint, plantar aspect of the right first and fifth metatarsal head no open ulceration All symptomatic hyperkeratoses were safely debrided with a sterile #15 blade to patient's level of comfort without  incident. We discussed preventative and palliative care of these lesions including supportive and accommodative shoegear, padding, prefabricated and custom molded accommodative orthoses, use of a pumice stone and lotions/creams daily.   Onychomycosis with pain  -Nails palliatively debrided as below. -Educated on self-care  Procedure: Nail Debridement Rationale: Pain Type of Debridement: manual, sharp debridement. Instrumentation: Nail nipper, rotary burr. Number of Nails: 10  Return in about 3 months (around 02/15/2022) for RFC.         Everitt Amber, DPM [facility] / [facility]

## 2021-11-15 NOTE — Addendum Note (Signed)
Addended by: Ames Coupe F on: 11/15/2021 11:46 AM   Modules accepted: Orders

## 2022-01-18 ENCOUNTER — Encounter: Payer: Self-pay | Admitting: Podiatry

## 2022-01-18 ENCOUNTER — Ambulatory Visit: Payer: Medicare HMO | Admitting: Podiatry

## 2022-01-18 DIAGNOSIS — M79671 Pain in right foot: Secondary | ICD-10-CM

## 2022-01-18 DIAGNOSIS — I739 Peripheral vascular disease, unspecified: Secondary | ICD-10-CM

## 2022-01-18 DIAGNOSIS — I872 Venous insufficiency (chronic) (peripheral): Secondary | ICD-10-CM | POA: Diagnosis not present

## 2022-01-18 DIAGNOSIS — M79672 Pain in left foot: Secondary | ICD-10-CM

## 2022-01-18 DIAGNOSIS — G629 Polyneuropathy, unspecified: Secondary | ICD-10-CM

## 2022-01-18 NOTE — Progress Notes (Signed)
Subjective:  Patient ID: Sean [name], male    DOB: [birth year],  MRN: [id number]  Chief Complaint  Patient presents with   Peripheral Neuropathy    Neuropathy; bil    81 y.o. male presents with the above complaint.  Patient states he has some bilateral foot burning and tingling pain.  He also notes that he has some chronic edema in his legs.  He is not sure if the edema is causing his pain.  He is not yet wearing compression stockings but is planning to pick some up and try them soon.  He denies a history of diabetes.  He is taking gabapentin 600 mg at night.  Objective:  Physical Exam: warm, good capillary refill, nail exam onychomycosis of the toenails, onycholysis, and dystrophic nails, callus present DP pulses palpable, PT pulses palpable, and protective sensation absent Left Foot: Generalized discomfort subjectively with 2+ pitting edema of the foot ankle and lower leg Right Foot:  Generalized discomfort subjectively with 2+ pitting edema of the foot ankle and lower leg  No images are attached to the encounter.  Assessment:   1. Neuropathy   2. Venous (peripheral) insufficiency   3. PVD (peripheral vascular disease) ([place])   4. Pain in both feet       Plan:  Patient was evaluated and treated and all questions answered.  #Neuropathic pain versus osteoarthritic inflammatory pain -Discussed with the patient using Voltaren gel applied to his feet if they become sore especially after walking for extended distances. -Difficult to discern if his pain is primarily related to neuropathic pain versus osteoarthritis. -I also recommend treatment with increased gabapentin may be try 900 mg at night total.  #Bilateral lower extremity edema likely chronic venous insufficiency -Recommend referral to vein specialist vascular surgery for evaluation of chronic edema -Recommend compression therapy patient states he is picking up stockings.   Return in about 8 weeks (around 03/15/2022) for  Follow up bilateral foot pain, swelling.         Everitt Amber, DPM [facility] / [facility]

## 2022-01-20 ENCOUNTER — Ambulatory Visit: Payer: Medicare HMO | Admitting: Podiatry

## 2022-02-02 ENCOUNTER — Encounter: Payer: Self-pay | Admitting: Family Medicine

## 2022-02-08 ENCOUNTER — Other Ambulatory Visit: Payer: Self-pay | Admitting: *Deleted

## 2022-02-08 DIAGNOSIS — R609 Edema, unspecified: Secondary | ICD-10-CM

## 2022-02-15 ENCOUNTER — Ambulatory Visit: Payer: Medicare HMO | Admitting: Podiatry

## 2022-02-17 ENCOUNTER — Encounter (HOSPITAL_COMMUNITY): Payer: Medicare HMO

## 2022-02-22 ENCOUNTER — Ambulatory Visit: Payer: Medicare HMO | Admitting: Podiatry

## 2022-02-25 ENCOUNTER — Ambulatory Visit: Payer: Medicare HMO | Admitting: Podiatry

## 2022-02-25 VITALS — BP 130/78

## 2022-02-25 DIAGNOSIS — M79674 Pain in right toe(s): Secondary | ICD-10-CM

## 2022-02-25 DIAGNOSIS — M79675 Pain in left toe(s): Secondary | ICD-10-CM | POA: Diagnosis not present

## 2022-02-25 DIAGNOSIS — G629 Polyneuropathy, unspecified: Secondary | ICD-10-CM

## 2022-02-25 DIAGNOSIS — I739 Peripheral vascular disease, unspecified: Secondary | ICD-10-CM

## 2022-02-25 DIAGNOSIS — Z7901 Long term (current) use of anticoagulants: Secondary | ICD-10-CM

## 2022-02-25 DIAGNOSIS — B351 Tinea unguium: Secondary | ICD-10-CM

## 2022-02-25 NOTE — Progress Notes (Signed)
  Subjective:  Patient ID: Sean [name], male    DOB: [birth year],  MRN: [id number]  Chief Complaint  Patient presents with   Nail Problem    Nail trim , possible corn on 5th toe on left foot    Foot Pain    Pt states 4 & 5 toe of left foot having been causing him dull achy pains , he states the more he walks the more it hurts . Pain started in December and has progressively gotten worse     82 y.o. male presents with the above complaint. History confirmed with patient. Patient presenting with pain related to dystrophic thickened elongated nails. Patient is unable to trim own nails related to nail dystrophy and/or mobility issues. Patient does not have a history of T2DM. Patient does have callus present located at the dorsolateral aspect of L 5th toe causing pain.   Objective:  Physical Exam: warm, good capillary refill nail exam onychomycosis of the toenails, onycholysis, and dystrophic nails DP pulses palpable, PT pulses palpable, and protective sensation absent Left Foot:  Pain with palpation of nails due to elongation and dystrophic growth.  Right Foot: Pain with palpation of nails due to elongation and dystrophic growth.   Assessment:   1. Pain due to onychomycosis of toenails of both feet   2. PVD (peripheral vascular disease) ([place])   3. Neuropathy   4. Current use of long term anticoagulation      Plan:  Patient was evaluated and treated and all questions answered.  #Hyperkeratotic lesions/pre ulcerative calluses present dorsolateral aspect L 5th toe PIPJ, adductovarus toe deformity All symptomatic hyperkeratoses x 1 separate lesions were safely debrided with a sterile #10 blade to patient's level of comfort without incident. We discussed preventative and palliative care of these lesions including supportive and accommodative shoegear, padding, prefabricated and custom molded accommodative orthoses, use of a pumice stone and lotions/creams daily.  #Onychomycosis with pain   -Nails palliatively debrided as below. -Educated on self-care  Procedure: Nail Debridement Rationale: Pain Type of Debridement: manual, sharp debridement. Instrumentation: Nail nipper, rotary burr. Number of Nails: 10  Return in about 3 months (around 05/27/2022) for RFC.         Everitt Amber, DPM [facility] [place] / [facility]

## 2022-03-07 ENCOUNTER — Encounter: Payer: Self-pay | Admitting: Family Medicine

## 2022-03-15 ENCOUNTER — Ambulatory Visit: Payer: Medicare HMO | Admitting: Podiatry

## 2022-03-21 ENCOUNTER — Ambulatory Visit (HOSPITAL_COMMUNITY): Payer: Medicare HMO

## 2022-03-26 ENCOUNTER — Encounter: Payer: Self-pay | Admitting: Vascular Surgery

## 2022-04-18 ENCOUNTER — Other Ambulatory Visit: Payer: Self-pay | Admitting: *Deleted

## 2022-04-18 DIAGNOSIS — R609 Edema, unspecified: Secondary | ICD-10-CM

## 2022-05-02 ENCOUNTER — Ambulatory Visit: Payer: Medicare HMO | Admitting: Physician Assistant

## 2022-05-02 ENCOUNTER — Ambulatory Visit (HOSPITAL_COMMUNITY)
Admission: RE | Admit: 2022-05-02 | Discharge: 2022-05-02 | Disposition: A | Discharge status: Home / Self Care | Payer: Medicare HMO | Source: Ambulatory Visit | Attending: Vascular Surgery | Admitting: Vascular Surgery

## 2022-05-02 VITALS — BP 115/59 | HR 67 | Temp 97.2°F | Resp 16 | Ht 67.0 in | Wt 170.0 lb

## 2022-05-02 DIAGNOSIS — I872 Venous insufficiency (chronic) (peripheral): Secondary | ICD-10-CM

## 2022-05-02 DIAGNOSIS — R609 Edema, unspecified: Secondary | ICD-10-CM

## 2022-05-02 NOTE — Progress Notes (Signed)
Office Note     CC:  follow up Requesting Provider:  Yevonne Pax*  HPI: Sean [name] is a 82 y.o. ([birth year]) male who presents for evaluation of bilateral lower extremity edema right more so than left.  He states he has noticed leg swelling for many years however has become more prominent here recently.  Swelling is worse towards the end of the day after being on his feet.  He denies any history of DVT, venous ulcerations, trauma, or prior vascular interventions of the legs.  He does not wear compression or make an effort to elevate his legs during the day.  Past medical history also significant for hypertension, hyperlipidemia.  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   Past Medical History:  Diagnosis Date   Arthritis    Blood transfusion without reported diagnosis    Cataract    Heart murmur    Hyperlipidemia    Hypertension    MVP (mitral valve prolapse)    Sinus problem     Past Surgical History:  Procedure Laterality Date   EYE SURGERY     open heart surgery      Social History   Socioeconomic History   Marital status: Married    Spouse name: Not on file   Number of children: Not on file   Years of education: Not on file   Highest education level: Not on file  Occupational History   Not on file  Tobacco Use   Smoking status: Never   Smokeless tobacco: Never  Substance and Sexual Activity   Alcohol use: No   Drug use: No   Sexual activity: Not on file  Other Topics Concern   Not on file  Social History Narrative   Not on file   Social Determinants of Health   Financial Resource Strain: Not on file  Food Insecurity: Not on file  Transportation Needs: Not on file  Physical Activity: Not on file  Stress: Not on file  Social Connections: Not on file  Intimate Partner Violence: Not on file   History reviewed. No pertinent family history.  Current Outpatient Medications  Medication Sig Dispense Refill    acetaminophen (TYLENOL) 325 MG tablet Take by mouth.     aspirin 81 MG chewable tablet Chew 81 mg by mouth.     b complex vitamins capsule Take 1 capsule by mouth daily.     cetirizine (ZYRTEC) 10 MG tablet TK 1 T PO D PRF COG     Cholecalciferol 125 MCG (5000 UT) TABS cholecalciferol (vitamin D3) 125 mcg (5,000 unit) tablet   5000 units by oral route.     dutasteride (AVODART) 0.5 MG capsule      gabapentin (NEURONTIN) 300 MG capsule TAKE 1 CAPSULE BY MOUTH THREE TIMES DAILY.(1 CAPSULE AT LUNCHTIME AND 2 CAPSULES AT BEDTIME) 90 capsule 3   gabapentin (NEURONTIN) 300 MG capsule Take 1 capsule (300 mg total) by mouth 3 (three) times daily. 90 capsule 3   Influenza Vac Typ A&B Surf Ant 0.5 ML SUSY Fluad 2017-18 48yr up(PF)45 mcg(15 mcgx3)/0.5 mL intramuscular syringe     metoprolol (LOPRESSOR) 50 MG tablet      nitroGLYCERIN (NITROSTAT) 0.4 MG SL tablet Place under the tongue.     omeprazole (PRILOSEC) 40 MG capsule Take 40 mg by mouth daily.  0   pravastatin (PRAVACHOL) 20 MG tablet pravastatin 20 mg tablet  Take 1 tablet every day by oral route.  tamsulosin (FLOMAX) 0.4 MG CAPS capsule Take by mouth.     vitamin B-12 (CYANOCOBALAMIN) 500 MCG tablet Take by mouth.     warfarin (COUMADIN) 2 MG tablet Take 8 mg by mouth daily.     furosemide (LASIX) 20 MG tablet furosemide 20 mg tablet  TAKE 1 TABLET BY MOUTH TWICE DAILY AS NEEDED FOR SWELLING     Current Facility-Administered Medications  Medication Dose Route Frequency Provider Last Rate Last Admin   triamcinolone acetonide (KENALOG) 10 MG/ML injection 10 mg  10 mg Other Once Landis Martins, DPM        Allergies  Allergen Reactions   Meperidine Hcl Nausea Only   Demerol [Meperidine] Nausea And Vomiting     REVIEW OF SYSTEMS:   [X]  denotes positive finding, [ ]  denotes negative finding Cardiac  Comments:  Chest pain or chest pressure:    Shortness of breath upon exertion:    Short of breath when lying flat:    Irregular  heart rhythm:        Vascular    Pain in calf, thigh, or hip brought on by ambulation:    Pain in feet at night that wakes you up from your sleep:     Blood clot in your veins:    Leg swelling:         Pulmonary    Oxygen at home:    Productive cough:     Wheezing:         Neurologic    Sudden weakness in arms or legs:     Sudden numbness in arms or legs:     Sudden onset of difficulty speaking or slurred speech:    Temporary loss of vision in one eye:     Problems with dizziness:         Gastrointestinal    Blood in stool:     Vomited blood:         Genitourinary    Burning when urinating:     Blood in urine:        Psychiatric    Major depression:         Hematologic    Bleeding problems:    Problems with blood clotting too easily:        Skin    Rashes or ulcers:        Constitutional    Fever or chills:      PHYSICAL EXAMINATION:  Vitals:   05/02/22 1310  BP: (!) 115/59  Pulse: 67  Resp: 16  Temp: (!) 97.2 F (36.2 C)  TempSrc: Temporal  SpO2: 100%  Weight: 170 lb (77.1 kg)  Height: 5\' 7"  (1.702 m)    General:  WDWN in NAD; vital signs documented above Gait: Not observed HENT: WNL, normocephalic Pulmonary: normal non-labored breathing , without Rales, rhonchi,  wheezing Cardiac: regular HR Abdomen: soft, NT, no masses Skin: without rashes Vascular Exam/Pulses:  Right Left  Radial 2+ (normal) 2+ (normal)  DP 2+ (normal) 2+ (normal)   Extremities: Stasis pigmentation changes of bilateral lower extremities; edema of bilateral lower legs more so on the right compared to left; no venous ulcerations or weeping skin; no evidence of cellulitis or other infection Musculoskeletal: no muscle wasting or atrophy  Neurologic: A&O X 3;  No focal weakness or paresthesias are detected Psychiatric:  The pt has Normal affect.   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 male here for evaluation of bilateral lower extremity edema right more so than the left  -Patient has evidence of mild venous insufficiency.  He also has skin pigment changes on exam suggesting that he has had edema of his lower legs chronically.  He is not bothered much by the edema however would like to manage symptoms better.  Right lower extremity venous reflux study was negative for DVT.  Duplex was also negative for deep venous reflux.  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  Recommendations included 15 to 20 mmHg knee-high compression socks to be worn regularly, proper elevation of the legs above the level of the heart periodically throughout the day, and avoiding prolonged sitting and standing.  We can repeat this study in the future if he notices a drastic change in the amount of edema otherwise he can follow-up on an as-needed basis.   Dagoberto Ligas, PA-C [facility] [phone]  Clinic MD:   Trula Slade

## 2022-05-27 ENCOUNTER — Ambulatory Visit: Payer: Medicare HMO | Admitting: Podiatry

## 2022-06-03 ENCOUNTER — Ambulatory Visit: Payer: Medicare HMO | Admitting: Podiatry

## 2022-06-03 ENCOUNTER — Encounter: Payer: Self-pay | Admitting: Oncology

## 2022-06-03 DIAGNOSIS — L84 Corns and callosities: Secondary | ICD-10-CM | POA: Diagnosis not present

## 2022-06-03 DIAGNOSIS — Z7901 Long term (current) use of anticoagulants: Secondary | ICD-10-CM | POA: Diagnosis not present

## 2022-06-03 DIAGNOSIS — M79675 Pain in left toe(s): Secondary | ICD-10-CM

## 2022-06-03 DIAGNOSIS — B351 Tinea unguium: Secondary | ICD-10-CM

## 2022-06-03 DIAGNOSIS — M79674 Pain in right toe(s): Secondary | ICD-10-CM | POA: Diagnosis not present

## 2022-06-03 DIAGNOSIS — I739 Peripheral vascular disease, unspecified: Secondary | ICD-10-CM | POA: Diagnosis not present

## 2022-06-03 DIAGNOSIS — G629 Polyneuropathy, unspecified: Secondary | ICD-10-CM | POA: Diagnosis not present

## 2022-06-03 NOTE — Telephone Encounter (Signed)
done

## 2022-06-03 NOTE — Progress Notes (Signed)
  Subjective:  Patient ID: Sean [name], male    DOB: [birth year],  MRN: [id number]  Chief Complaint  Patient presents with   routine foot care     Nail trim and callouse removal on bilateral balls of feet     82 y.o. male presents with the above complaint. History confirmed with patient. Patient presenting with pain related to dystrophic thickened elongated nails. Patient is unable to trim own nails related to nail dystrophy and/or mobility issues. Patient does not have a history of T2DM. Patient does have callus present located at the plantar aspect of the bilateral fifth MPJ as well as the first MPJ  Objective:  Physical Exam: warm, good capillary refill nail exam onychomycosis of the toenails, onycholysis, and dystrophic nails DP pulses palpable, PT pulses palpable, and protective sensation absent Left Foot:  Pain with palpation of nails due to elongation and dystrophic growth.  Hyperkeratotic lesion subfirst and fifth metatarsal phalangeal joints without underlying ulceration Right Foot: Pain with palpation of nails due to elongation and dystrophic growth. Hyperkeratotic lesion subfirst and fifth metatarsal phalangeal joints without underlying ulceration  Assessment:   1. Pain due to onychomycosis of toenails of both feet   2. Pre-ulcerative calluses   3. PVD (peripheral vascular disease)   4. Neuropathy   5. Current use of long term anticoagulation       Plan:  Patient was evaluated and treated and all questions answered.  #Hyperkeratotic lesions/pre ulcerative calluses present plantar fifth and first metatarsophalangeal joint bilateral foot All symptomatic hyperkeratoses x 4 separate lesions were safely debrided with a sterile #10 blade to patient's level of comfort without incident. We discussed preventative and palliative care of these lesions including supportive and accommodative shoegear, padding, prefabricated and custom molded accommodative orthoses, use of a pumice  stone and lotions/creams daily.  #Onychomycosis with pain  -Nails palliatively debrided as below. -Educated on self-care  Procedure: Nail Debridement Rationale: Pain Type of Debridement: manual, sharp debridement. Instrumentation: Nail nipper, rotary burr. Number of Nails: 10  Return in about 10 weeks (around 08/12/2022) for RFC.         Corinna Gab, DPM [facility] / [facility]

## 2022-06-16 ENCOUNTER — Other Ambulatory Visit: Payer: Self-pay | Admitting: Oncology

## 2022-06-16 DIAGNOSIS — D61818 Other pancytopenia: Secondary | ICD-10-CM

## 2022-06-16 NOTE — Progress Notes (Signed)
[facility]  [address] [place],  [place]  [address] [phone]  Clinic Day:  06/17/2022  Referring physician: Shelle Iron, MD  HISTORY OF PRESENT ILLNESS:  The patient is a 82 y.o. 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  He comes in today as recent labs showed a low hemoglobin.  At his primary care office in April 2024, labs showed him to be pancytopenic, with a low white count of 2.5, a low hemoglobin of 9.3, and a low platelet count of 107.  His iron studies showed a ferritin of 45, an elevated serum iron of 227, a TIBC of 348 and an iron saturation of 65%.  Overall, the patient claims to be doing well.  He does complain of occasional fatigue, but denies having other particular changes in his health.  PHYSICAL EXAM:  Blood pressure (!) 146/65, pulse 69, temperature 97.7 F (36.5 C), resp. rate 16, height 5\' 7"  (1.702 m), weight 169 lb 4.8 oz (76.8 kg), SpO2 99 %. Wt Readings from Last 3 Encounters:  06/17/22 169 lb 4.8 oz (76.8 kg)  05/02/22 170 lb (77.1 kg)  10/27/21 172 lb 11.2 oz (78.3 kg)   Body mass index is 26.52 kg/m. Performance status (ECOG): 0 - Asymptomatic Physical Exam Constitutional:      General: He is not in acute distress.    Appearance: Normal appearance. He is normal weight. He is not ill-appearing, toxic-appearing or diaphoretic.  HENT:     Head: Normocephalic and atraumatic.     Nose: Nose normal. No congestion or rhinorrhea.     Mouth/Throat:     Mouth: Mucous membranes are moist.     Pharynx: Oropharynx is clear. No oropharyngeal exudate or posterior oropharyngeal erythema.  Eyes:     General: No scleral icterus.       Right eye: No discharge.        Left eye: No discharge.     Extraocular Movements: Extraocular movements intact.     Conjunctiva/sclera: Conjunctivae normal.     Pupils: Pupils are equal, round, and reactive to  light.  Neck:     Vascular: No carotid bruit.  Cardiovascular:     Rate and Rhythm: Normal rate and regular rhythm.     Heart sounds: No murmur heard.    No friction rub. No gallop.  Pulmonary:     Effort: Pulmonary effort is normal. No respiratory distress.     Breath sounds: Normal breath sounds. No stridor. No wheezing, rhonchi or rales.  Chest:     Chest wall: No tenderness.  Abdominal:     General: Abdomen is flat. Bowel sounds are normal. There is no distension.     Palpations: Abdomen is soft. There is no mass.     Tenderness: There is no abdominal tenderness. There is no right CVA tenderness, left CVA tenderness, guarding or rebound.     Hernia: No hernia is present.  Musculoskeletal:        General: No swelling, tenderness, deformity or signs of injury. Normal range of motion.     Cervical back: Normal range of motion and neck supple. No rigidity or tenderness.     Right lower leg: No edema.     Left lower leg: No edema.  Lymphadenopathy:     Cervical: No cervical adenopathy.     Upper Body:     Right upper body: No supraclavicular or axillary adenopathy.  Left upper body: No supraclavicular or axillary adenopathy.     Lower Body: No right inguinal adenopathy. No left inguinal adenopathy.  Skin:    General: Skin is warm and dry.     Capillary Refill: Capillary refill takes less than 2 seconds.     Coloration: Skin is not jaundiced or pale.     Findings: No bruising, erythema, lesion or rash.  Neurological:     General: No focal deficit present.     Mental Status: He is alert and oriented to person, place, and time. Mental status is at baseline.     Cranial Nerves: No cranial nerve deficit.     Sensory: No sensory deficit.     Motor: No weakness.     Coordination: Coordination normal.     Gait: Gait normal.     Deep Tendon Reflexes: Reflexes normal.  Psychiatric:        Mood and Affect: Mood normal.        Behavior: Behavior normal.        Thought Content:  Thought content normal.        Judgment: Judgment normal.   LABS:      Latest Ref Rng & Units 06/17/2022   12:00 AM 10/27/2021   12:00 AM 04/23/2021   12:00 AM  CBC  WBC  3.5     2.5     2.8   Hemoglobin 13.5 - 17.5 10.7     11.3     11.6   Hematocrit 41 - 53 33     34     35   Platelets 150 - 400 K/uL 104     82     97      This result is from an external source.    Latest Reference Range & Units 06/17/22 10:14  Iron 45 - 182 ug/dL 161  UIBC ug/dL 096  TIBC 045 - 409 ug/dL 811  Saturation Ratios 17.9 - 39.5 % 42 (H)  Ferritin 24 - 336 ng/mL 17 (L)  (H): Data is abnormally high (L): Data is abnormally low   Latest Reference Range & Units Most Recent  Vitamin B12 180 - 914 pg/mL 1,549 (H) 10/27/21 10:14  (H): Data is abnormally high  Latest Reference Range & Units Most Recent  Folate >5.9 ng/mL 17.2 10/27/21 10:14   ASSESSMENT & PLAN:  Assessment/Plan:  An 82 y.o. male with mild pancytopenia.  I made sure the patient understood that the anemia for which he was sent here for evaluation is part of his pancytopenia picture.  I once again explained to him why his cirrhosis and secondary splenomegaly are causing his low counts.  As his spleen gets larger, an increased sequestration of blood cells is coming from his cirrhotic liver, leading to a build-up of blood cells within it to where a person progressively becomes pancytopenic over time.  I made sure he understood that this is a common finding that is seen with anybody who has cirrhosis and secondary splenomegaly.  When evaluating his labs today, his ferritin level is low, which suggests that he is somewhat iron deficient.  Based upon this, I will arrange for him to receive IV iron over these next few weeks to replenish his iron stores.  Hopefully, this will lead to improvement in his hemoglobin over time.  I will refer him to a liver specialist to have his cirrhosis evaluated.  He understands that with his current clinical picture,  pancytopenia will likely always be present  moving forward.  I will see him back in 4 months for repeat clinical assessment.  The patient understands all the plans discussed today and is in agreement with them.    Kaiyah Eber Kirby Funk, MD

## 2022-06-17 ENCOUNTER — Inpatient Hospital Stay: Payer: Medicare HMO | Attending: Oncology | Admitting: Oncology

## 2022-06-17 ENCOUNTER — Inpatient Hospital Stay: Payer: Medicare HMO

## 2022-06-17 VITALS — BP 146/65 | HR 69 | Temp 97.7°F | Resp 16 | Ht 67.0 in | Wt 169.3 lb

## 2022-06-17 DIAGNOSIS — Z79899 Other long term (current) drug therapy: Secondary | ICD-10-CM | POA: Diagnosis not present

## 2022-06-17 DIAGNOSIS — D508 Other iron deficiency anemias: Secondary | ICD-10-CM

## 2022-06-17 DIAGNOSIS — D61818 Other pancytopenia: Secondary | ICD-10-CM

## 2022-06-17 DIAGNOSIS — D509 Iron deficiency anemia, unspecified: Secondary | ICD-10-CM | POA: Diagnosis present

## 2022-06-17 LAB — CMP (CANCER CENTER ONLY)
ALT: 35 U/L (ref 0–44)
AST: 33 U/L (ref 15–41)
Albumin: 3.5 g/dL (ref 3.5–5.0)
Alkaline Phosphatase: 99 U/L (ref 38–126)
Anion gap: 5 (ref 5–15)
BUN: 25 mg/dL — ABNORMAL HIGH (ref 8–23)
CO2: 25 mmol/L (ref 22–32)
Calcium: 8.7 mg/dL — ABNORMAL LOW (ref 8.9–10.3)
Chloride: 109 mmol/L (ref 98–111)
Creatinine: 0.78 mg/dL (ref 0.61–1.24)
GFR, Estimated: >60 mL/min (ref >60)
Glucose, Bld: 93 mg/dL (ref 70–99)
Potassium: 4 mmol/L (ref 3.5–5.1)
Sodium: 139 mmol/L (ref 135–145)
Total Bilirubin: 0.6 mg/dL (ref 0.3–1.2)
Total Protein: 6.8 g/dL (ref 6.5–8.1)

## 2022-06-17 LAB — CBC AND DIFFERENTIAL
HCT: 33 — AB (ref 41–53)
Hemoglobin: 10.7 — AB (ref 13.5–17.5)
Neutrophils Absolute: 2.49
Platelets: 104 10*3/uL — AB (ref 150–400)
WBC: 3.5

## 2022-06-17 LAB — IRON AND TIBC
Iron: 181 ug/dL (ref 45–182)
Saturation Ratios: 42 % — ABNORMAL HIGH (ref 17.9–39.5)
TIBC: 428 ug/dL (ref 250–450)
UIBC: 247 ug/dL

## 2022-06-17 LAB — CBC: RBC: 3.61 — AB (ref 3.87–5.11)

## 2022-06-17 LAB — FERRITIN: Ferritin: 17 ng/mL — ABNORMAL LOW (ref 24–336)

## 2022-06-18 ENCOUNTER — Encounter: Payer: Self-pay | Admitting: Oncology

## 2022-06-18 ENCOUNTER — Other Ambulatory Visit: Payer: Self-pay | Admitting: Oncology

## 2022-06-18 DIAGNOSIS — D508 Other iron deficiency anemias: Secondary | ICD-10-CM

## 2022-06-19 LAB — SOLUBLE TRANSFERRIN RECEPTOR: Transferrin Receptor: 32.7 nmol/L — ABNORMAL HIGH (ref 12.2–27.3)

## 2022-06-21 ENCOUNTER — Encounter: Payer: Self-pay | Admitting: Oncology

## 2022-06-22 MED FILL — Iron Sucrose Inj 20 MG/ML (Fe Equiv): INTRAVENOUS | Qty: 10 | Status: AC

## 2022-06-23 ENCOUNTER — Inpatient Hospital Stay: Payer: Medicare HMO

## 2022-06-23 VITALS — BP 155/70 | HR 68 | Temp 97.5°F | Resp 20 | Ht 67.0 in | Wt 171.0 lb

## 2022-06-23 DIAGNOSIS — D509 Iron deficiency anemia, unspecified: Secondary | ICD-10-CM | POA: Diagnosis not present

## 2022-06-23 DIAGNOSIS — D508 Other iron deficiency anemias: Secondary | ICD-10-CM

## 2022-06-23 MED ORDER — SODIUM CHLORIDE 0.9 % IV SOLN: Freq: Once | INTRAVENOUS | Status: AC

## 2022-06-23 MED ORDER — SODIUM CHLORIDE 0.9 % IV SOLN
200.0000 mg | Freq: Once | INTRAVENOUS | Status: AC
  Administered 2022-06-23: 200 mg via INTRAVENOUS
  Filled 2022-06-23: qty 200

## 2022-06-23 NOTE — Patient Instructions (Signed)

## 2022-06-24 MED FILL — Iron Sucrose Inj 20 MG/ML (Fe Equiv): INTRAVENOUS | Qty: 10 | Status: AC

## 2022-06-27 ENCOUNTER — Inpatient Hospital Stay: Payer: Medicare HMO

## 2022-06-27 VITALS — BP 112/50 | HR 74 | Temp 97.4°F | Resp 18

## 2022-06-27 DIAGNOSIS — D509 Iron deficiency anemia, unspecified: Secondary | ICD-10-CM | POA: Diagnosis not present

## 2022-06-27 DIAGNOSIS — D508 Other iron deficiency anemias: Secondary | ICD-10-CM

## 2022-06-27 MED ORDER — SODIUM CHLORIDE 0.9 % IV SOLN: Freq: Once | INTRAVENOUS | Status: AC

## 2022-06-27 MED ORDER — SODIUM CHLORIDE 0.9 % IV SOLN
200.0000 mg | Freq: Once | INTRAVENOUS | Status: AC
  Administered 2022-06-27: 200 mg via INTRAVENOUS
  Filled 2022-06-27: qty 200

## 2022-06-27 NOTE — Patient Instructions (Signed)

## 2022-06-30 MED FILL — Iron Sucrose Inj 20 MG/ML (Fe Equiv): INTRAVENOUS | Qty: 10 | Status: AC

## 2022-07-01 ENCOUNTER — Inpatient Hospital Stay: Payer: Medicare HMO

## 2022-07-01 VITALS — BP 120/60 | HR 74 | Temp 97.7°F | Resp 18 | Ht 67.0 in | Wt 170.0 lb

## 2022-07-01 DIAGNOSIS — D509 Iron deficiency anemia, unspecified: Secondary | ICD-10-CM | POA: Diagnosis not present

## 2022-07-01 DIAGNOSIS — D508 Other iron deficiency anemias: Secondary | ICD-10-CM

## 2022-07-01 MED ORDER — SODIUM CHLORIDE 0.9 % IV SOLN: Freq: Once | INTRAVENOUS | Status: AC

## 2022-07-01 MED ORDER — SODIUM CHLORIDE 0.9 % IV SOLN
200.0000 mg | Freq: Once | INTRAVENOUS | Status: AC
  Administered 2022-07-01: 200 mg via INTRAVENOUS
  Filled 2022-07-01: qty 200

## 2022-07-01 NOTE — Patient Instructions (Signed)

## 2022-07-05 MED FILL — Iron Sucrose Inj 20 MG/ML (Fe Equiv): INTRAVENOUS | Qty: 10 | Status: AC

## 2022-07-06 ENCOUNTER — Inpatient Hospital Stay: Payer: Medicare HMO

## 2022-07-06 VITALS — BP 132/62 | HR 77 | Resp 14 | Ht 67.0 in | Wt 169.0 lb

## 2022-07-06 DIAGNOSIS — D508 Other iron deficiency anemias: Secondary | ICD-10-CM

## 2022-07-06 DIAGNOSIS — D509 Iron deficiency anemia, unspecified: Secondary | ICD-10-CM | POA: Diagnosis not present

## 2022-07-06 MED ORDER — SODIUM CHLORIDE 0.9 % IV SOLN
200.0000 mg | Freq: Once | INTRAVENOUS | Status: AC
  Administered 2022-07-06: 200 mg via INTRAVENOUS
  Filled 2022-07-06: qty 200

## 2022-07-06 MED ORDER — SODIUM CHLORIDE 0.9 % IV SOLN: Freq: Once | INTRAVENOUS | Status: AC

## 2022-07-06 NOTE — Patient Instructions (Signed)

## 2022-07-08 MED FILL — Iron Sucrose Inj 20 MG/ML (Fe Equiv): INTRAVENOUS | Qty: 10 | Status: AC

## 2022-07-12 ENCOUNTER — Inpatient Hospital Stay: Payer: Medicare HMO

## 2022-07-12 VITALS — BP 127/62 | HR 71 | Temp 98.1°F | Resp 16 | Ht 67.0 in | Wt 170.0 lb

## 2022-07-12 DIAGNOSIS — D508 Other iron deficiency anemias: Secondary | ICD-10-CM

## 2022-07-12 DIAGNOSIS — D509 Iron deficiency anemia, unspecified: Secondary | ICD-10-CM | POA: Diagnosis not present

## 2022-07-12 MED ORDER — SODIUM CHLORIDE 0.9 % IV SOLN: Freq: Once | INTRAVENOUS | Status: AC

## 2022-07-12 MED ORDER — SODIUM CHLORIDE 0.9 % IV SOLN
200.0000 mg | Freq: Once | INTRAVENOUS | Status: AC
  Administered 2022-07-12: 200 mg via INTRAVENOUS
  Filled 2022-07-12: qty 200

## 2022-07-12 NOTE — Patient Instructions (Signed)

## 2022-07-13 ENCOUNTER — Other Ambulatory Visit: Payer: Self-pay

## 2022-08-22 ENCOUNTER — Ambulatory Visit: Payer: Medicare HMO | Admitting: Podiatry

## 2022-08-22 DIAGNOSIS — B351 Tinea unguium: Secondary | ICD-10-CM

## 2022-08-22 DIAGNOSIS — M79674 Pain in right toe(s): Secondary | ICD-10-CM | POA: Diagnosis not present

## 2022-08-22 DIAGNOSIS — L84 Corns and callosities: Secondary | ICD-10-CM | POA: Diagnosis not present

## 2022-08-22 DIAGNOSIS — M79675 Pain in left toe(s): Secondary | ICD-10-CM

## 2022-08-22 NOTE — Progress Notes (Signed)
  Subjective:  Patient ID: Sean [name], male    DOB: 01/09/1941,  MRN: [id number]  Chief Complaint  Patient presents with   Nail Problem    Routine Foot Care- Nail trim     82 y.o. male presents with the above complaint. History confirmed with patient. Patient presenting with pain related to dystrophic thickened elongated nails. Patient is unable to trim own nails related to nail dystrophy and/or mobility issues. Patient does not have a history of T2DM. Patient does have callus present located at the plantar aspect of the bilateral fifth MPJ as well as the first MPJ  Objective:  Physical Exam: warm, good capillary refill nail exam onychomycosis of the toenails, onycholysis, and dystrophic nails DP pulses palpable, PT pulses palpable, and protective sensation absent Left Foot:  Pain with palpation of nails due to elongation and dystrophic growth.  Hyperkeratotic lesion subfirst and fifth metatarsal phalangeal joints without underlying ulceration Right Foot: Pain with palpation of nails due to elongation and dystrophic growth. Hyperkeratotic lesion subfirst and fifth metatarsal phalangeal joints without underlying ulceration  Assessment:   1. Pre-ulcerative calluses   2. Pain due to onychomycosis of toenails of both feet        Plan:  Patient was evaluated and treated and all questions answered.  #Hyperkeratotic lesions/pre ulcerative calluses present plantar fifth and first metatarsophalangeal joint bilateral foot All symptomatic hyperkeratoses x 4 separate lesions were safely debrided with a sterile #10 blade to patient's level of comfort without incident. We discussed preventative and palliative care of these lesions including supportive and accommodative shoegear, padding, prefabricated and custom molded accommodative orthoses, use of a pumice stone and lotions/creams daily.  #Onychomycosis with pain  -Nails palliatively debrided as below. -Educated on self-care  Procedure:  Nail Debridement Rationale: Pain Type of Debridement: manual, sharp debridement. Instrumentation: Nail nipper, rotary burr. Number of Nails: 10  Return in about 3 months (around 11/22/2022) for RFC.         Corinna Gab, DPM [facility] / [facility]

## 2022-09-05 ENCOUNTER — Ambulatory Visit: Payer: Medicare HMO | Admitting: Podiatry

## 2022-09-25 DIAGNOSIS — G629 Polyneuropathy, unspecified: Secondary | ICD-10-CM | POA: Insufficient documentation

## 2022-10-17 NOTE — Progress Notes (Unsigned)
[facility]  [address] [place],  [place]  [address] [phone]  Clinic Day:  10/18/2022  Referring physician: Shelle Iron, MD  HISTORY OF PRESENT ILLNESS:  The patient is a 82 y.o. 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  Recent labs also showed him to be iron deficient, for which he recently received IV iron.  He comes in today to reassess his peripheral counts.  Since his last visit, the patient has been re-evaluated by hepatology, who performed another ultrasound which showed stability in his cirrhosis and splenomegaly.  Overall, the patient claims to be doing well.  He denies having increased fatigue or any overt forms of blood loss since his last visit.   PHYSICAL EXAM:  Blood pressure 137/65, pulse 85, temperature 97.6 F (36.4 C), resp. rate 16, height 5\' 7"  (1.702 m), weight 177 lb (80.3 kg), SpO2 100%. Wt Readings from Last 3 Encounters:  10/18/22 177 lb (80.3 kg)  07/12/22 170 lb (77.1 kg)  07/06/22 169 lb 0.6 oz (76.7 kg)   Body mass index is 27.72 kg/m. Performance status (ECOG): 0 - Asymptomatic Physical Exam Constitutional:      General: He is not in acute distress.    Appearance: Normal appearance. He is normal weight. He is not ill-appearing, toxic-appearing or diaphoretic.  HENT:     Head: Normocephalic and atraumatic.     Nose: Nose normal. No congestion or rhinorrhea.     Mouth/Throat:     Mouth: Mucous membranes are moist.     Pharynx: Oropharynx is clear. No oropharyngeal exudate or posterior oropharyngeal erythema.  Eyes:     General: No scleral icterus.       Right eye: No discharge.        Left eye: No discharge.     Extraocular Movements: Extraocular movements intact.     Conjunctiva/sclera: Conjunctivae normal.     Pupils: Pupils are equal, round, and reactive to light.  Neck:     Vascular: No carotid bruit.  Cardiovascular:      Rate and Rhythm: Normal rate and regular rhythm.     Heart sounds: No murmur heard.    No friction rub. No gallop.  Pulmonary:     Effort: Pulmonary effort is normal. No respiratory distress.     Breath sounds: Normal breath sounds. No stridor. No wheezing, rhonchi or rales.  Chest:     Chest wall: No tenderness.  Abdominal:     General: Abdomen is flat. Bowel sounds are normal. There is no distension.     Palpations: Abdomen is soft. There is no mass.     Tenderness: There is no abdominal tenderness. There is no right CVA tenderness, left CVA tenderness, guarding or rebound.     Hernia: No hernia is present.  Musculoskeletal:        General: No swelling, tenderness, deformity or signs of injury. Normal range of motion.     Cervical back: Normal range of motion and neck supple. No rigidity or tenderness.     Right lower leg: No edema.     Left lower leg: No edema.  Lymphadenopathy:     Cervical: No cervical adenopathy.     Upper Body:     Right upper body: No supraclavicular or axillary adenopathy.     Left upper body: No supraclavicular or axillary adenopathy.     Lower Body: No right inguinal adenopathy. No left  inguinal adenopathy.  Skin:    General: Skin is warm and dry.     Capillary Refill: Capillary refill takes less than 2 seconds.     Coloration: Skin is not jaundiced or pale.     Findings: No bruising, erythema, lesion or rash.  Neurological:     General: No focal deficit present.     Mental Status: He is alert and oriented to person, place, and time. Mental status is at baseline.     Cranial Nerves: No cranial nerve deficit.     Sensory: No sensory deficit.     Motor: No weakness.     Coordination: Coordination normal.     Gait: Gait normal.     Deep Tendon Reflexes: Reflexes normal.  Psychiatric:        Mood and Affect: Mood normal.        Behavior: Behavior normal.        Thought Content: Thought content normal.        Judgment: Judgment normal.   LABS:       Latest Ref Rng & Units 10/18/2022   12:00 AM 06/17/2022   12:00 AM 10/27/2021   12:00 AM  CBC  WBC  2.1     3.5     2.5      Hemoglobin 13.5 - 17.5 10.0     10.7     11.3      Hematocrit 41 - 53 30     33     34      Platelets 150 - 400 K/uL 88     104     82         This result is from an external source.     Latest Reference Range & Units 10/18/22 10:13  Iron 45 - 182 ug/dL 38 (L)  UIBC ug/dL 161  TIBC 096 - 045 ug/dL 409  Saturation Ratios 17.9 - 39.5 % 9 (L)  Ferritin 24 - 336 ng/mL 18 (L)  (L): Data is abnormally low  ASSESSMENT & PLAN:  Assessment/Plan:  An 82 y.o. male with pancytopenia due to his cirrhosis and secondary splenomegaly.  When evaluating his labs today, they are not much different than what they have been previously.  His iron studies show that he is still very iron deficient.  Based upon this, I will arrange for him to receive another course of IV iron.  The major question is whether he may be having GI blood loss from potential gastroesophageal varices related to his cirrhosis. Ultimately, this gentleman may need an EGD +/- colonoscopy to rule out such a disease process.  He understands that with his cirrhosis/splenomegaly, pancytopenia will likely always be present moving forward.  As he is clinically stable, I will see him back in 3 months for repeat clinical assessment.  The patient understands all the plans discussed today and is in agreement with them.    Demarie Hyneman Kirby Funk, MD

## 2022-10-18 ENCOUNTER — Inpatient Hospital Stay: Payer: Medicare HMO | Attending: Oncology

## 2022-10-18 ENCOUNTER — Other Ambulatory Visit: Payer: Self-pay | Admitting: Oncology

## 2022-10-18 ENCOUNTER — Inpatient Hospital Stay: Payer: Medicare HMO | Admitting: Oncology

## 2022-10-18 ENCOUNTER — Telehealth: Payer: Self-pay

## 2022-10-18 VITALS — BP 137/65 | HR 85 | Temp 97.6°F | Resp 16 | Ht 67.0 in | Wt 177.0 lb

## 2022-10-18 DIAGNOSIS — D61818 Other pancytopenia: Secondary | ICD-10-CM | POA: Insufficient documentation

## 2022-10-18 DIAGNOSIS — D508 Other iron deficiency anemias: Secondary | ICD-10-CM | POA: Diagnosis not present

## 2022-10-18 DIAGNOSIS — R161 Splenomegaly, not elsewhere classified: Secondary | ICD-10-CM | POA: Insufficient documentation

## 2022-10-18 DIAGNOSIS — E611 Iron deficiency: Secondary | ICD-10-CM | POA: Insufficient documentation

## 2022-10-18 LAB — IRON AND TIBC
Iron: 38 ug/dL — ABNORMAL LOW (ref 45–182)
Saturation Ratios: 9 % — ABNORMAL LOW (ref 17.9–39.5)
TIBC: 409 ug/dL (ref 250–450)
UIBC: 371 ug/dL

## 2022-10-18 LAB — FERRITIN: Ferritin: 18 ng/mL — ABNORMAL LOW (ref 24–336)

## 2022-10-18 LAB — CBC: RBC: 3.25 — AB (ref 3.87–5.11)

## 2022-10-18 LAB — CMP (CANCER CENTER ONLY)
ALT: 29 U/L (ref 0–44)
AST: 34 U/L (ref 15–41)
Albumin: 3.3 g/dL — ABNORMAL LOW (ref 3.5–5.0)
Alkaline Phosphatase: 95 U/L (ref 38–126)
Anion gap: 8 (ref 5–15)
BUN: 21 mg/dL (ref 8–23)
CO2: 23 mmol/L (ref 22–32)
Calcium: 8.9 mg/dL (ref 8.9–10.3)
Chloride: 110 mmol/L (ref 98–111)
Creatinine: 0.66 mg/dL (ref 0.61–1.24)
GFR, Estimated: >60 mL/min (ref >60)
Glucose, Bld: 121 mg/dL — ABNORMAL HIGH (ref 70–99)
Potassium: 3.7 mmol/L (ref 3.5–5.1)
Sodium: 141 mmol/L (ref 135–145)
Total Bilirubin: 0.9 mg/dL (ref 0.3–1.2)
Total Protein: 6.6 g/dL (ref 6.5–8.1)

## 2022-10-18 LAB — CBC AND DIFFERENTIAL
HCT: 30 — AB (ref 41–53)
Hemoglobin: 10 — AB (ref 13.5–17.5)
Neutrophils Absolute: 1.49
Platelets: 88 10*3/uL — AB (ref 150–400)
WBC: 2.1

## 2022-10-18 NOTE — Telephone Encounter (Signed)
  Latest Reference Range & Units 10/18/22 10:13   Iron 45 - 182 ug/dL 38 (L)  UIBC ug/dL 161  TIBC 096 - 045 ug/dL 409  Saturation Ratios 17.9 - 39.5 % 9 (L)  Ferritin 24 - 336 ng/mL 18 (L)  (L): Data is abnormally low   ASSESSMENT & PLAN:  Assessment/Plan:  An 82 y.o. male with pancytopenia due to his cirrhosis and secondary splenomegaly.  When evaluating his labs today, they are not much different than what they have been previously.  His iron studies show that he is still very iron deficient.  Based upon this, I will arrange for him to receive another course of IV iron.  The major question is whether he may be having GI blood loss from potential gastroesophageal varices related to his cirrhosis. Ultimately, this gentleman may need an EGD +/- colonoscopy to rule out such a disease process.  He understands that with his cirrhosis/splenomegaly, pancytopenia will likely always be present moving forward.  As he is clinically stable, I will see him back in 3 months for repeat clinical assessment.  The patient understands all the plans discussed today and is in agreement with them.     Dequincy Kirby Funk, MD

## 2022-10-20 ENCOUNTER — Telehealth: Payer: Self-pay | Admitting: Oncology

## 2022-10-20 ENCOUNTER — Encounter: Payer: Self-pay | Admitting: Oncology

## 2022-10-20 NOTE — Telephone Encounter (Signed)
10/20/22 Patient requested to delay treatment until after OCT.Will call back to schedule IRON.

## 2022-10-20 NOTE — Telephone Encounter (Signed)
10/20/22 LVM to schedule IV iron.

## 2022-10-27 DIAGNOSIS — I4430 Unspecified atrioventricular block: Secondary | ICD-10-CM | POA: Insufficient documentation

## 2022-10-28 ENCOUNTER — Other Ambulatory Visit: Payer: Medicare HMO

## 2022-10-28 ENCOUNTER — Ambulatory Visit: Payer: Medicare HMO | Admitting: Oncology

## 2022-11-21 ENCOUNTER — Ambulatory Visit: Payer: Medicare HMO | Admitting: Podiatry

## 2022-11-29 ENCOUNTER — Ambulatory Visit: Payer: Medicare HMO | Admitting: Podiatry

## 2022-12-13 ENCOUNTER — Ambulatory Visit (INDEPENDENT_AMBULATORY_CARE_PROVIDER_SITE_OTHER): Payer: Medicare HMO | Admitting: Podiatry

## 2022-12-13 DIAGNOSIS — Z91199 Patient's noncompliance with other medical treatment and regimen due to unspecified reason: Secondary | ICD-10-CM

## 2022-12-13 NOTE — Progress Notes (Signed)
 Patient absent for apointment

## 2022-12-21 ENCOUNTER — Other Ambulatory Visit: Payer: Medicare HMO

## 2022-12-21 ENCOUNTER — Ambulatory Visit: Payer: Medicare HMO | Admitting: Oncology

## 2022-12-22 ENCOUNTER — Ambulatory Visit: Payer: Medicare HMO | Admitting: Podiatry

## 2023-01-16 ENCOUNTER — Telehealth: Payer: Self-pay | Admitting: Oncology

## 2023-01-16 NOTE — Telephone Encounter (Signed)
01/16/23 Patient cancelled appt.Will call back to reschedule at a later date.

## 2023-01-18 ENCOUNTER — Inpatient Hospital Stay: Payer: Medicare HMO | Attending: Oncology

## 2023-01-18 ENCOUNTER — Inpatient Hospital Stay: Payer: Medicare HMO | Admitting: Oncology

## 2023-03-01 ENCOUNTER — Encounter: Payer: Self-pay | Admitting: Podiatry

## 2023-03-01 ENCOUNTER — Ambulatory Visit: Payer: Medicare HMO | Admitting: Podiatry

## 2023-03-01 DIAGNOSIS — I739 Peripheral vascular disease, unspecified: Secondary | ICD-10-CM

## 2023-03-01 DIAGNOSIS — G629 Polyneuropathy, unspecified: Secondary | ICD-10-CM | POA: Diagnosis not present

## 2023-03-01 DIAGNOSIS — M79674 Pain in right toe(s): Secondary | ICD-10-CM

## 2023-03-01 DIAGNOSIS — B351 Tinea unguium: Secondary | ICD-10-CM

## 2023-03-01 DIAGNOSIS — M79675 Pain in left toe(s): Secondary | ICD-10-CM

## 2023-03-01 DIAGNOSIS — L84 Corns and callosities: Secondary | ICD-10-CM

## 2023-03-01 MED ORDER — DULOXETINE HCL 30 MG PO CPEP
30.0000 mg | ORAL_CAPSULE | Freq: Every day | ORAL | 3 refills | Status: DC
Start: 2023-03-01 — End: 2023-04-25

## 2023-03-01 NOTE — Progress Notes (Signed)
Subjective:  Patient ID: Sean [name], male    DOB: August 28, 1940,  MRN: [id number]  Kin Totty Udovich presents to clinic today for:  Chief Complaint  Patient presents with   [facility]    RFC today, not diabetic, takes warfrin and ASA. He stated that he still works and one of the previous docs had him on walking restictions and needs that renewed. Said they wanted him sitting as much as possible.     Patient notes nails are thick and elongated, causing pain in shoe gear when ambulating.  He also has painful calluses bilateral submet 5.  He is currently taking gabapentin 900 mg 3 times daily, and notes that this has not been helpful for his neuropathy discomfort.  He is open to trying a new medication.  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  He is requesting a work note to have restrictions on walking while at work.  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  He stated that one of our previous doctors provided this note for him and he would like a new note today.  PCP is Shelle Iron, MD. last seen on 03/02/2023  Past Medical History:  Diagnosis Date   Arthritis    Blood transfusion without reported diagnosis    Cataract    Heart murmur    Hyperlipidemia    Hypertension    MVP (mitral valve prolapse)    Sinus problem     Allergies  Allergen Reactions   Meperidine Hcl Nausea Only   Demerol [Meperidine] Nausea And Vomiting    Objective:  Kazumi Brumbley is a pleasant 83 y.o. male in NAD. AAO x 3.  Vascular Examination: Patient has palpable DP pulse, absent PT pulse bilateral.  Delayed capillary refill bilateral toes.  Sparse digital hair bilateral.  Proximal to distal cooling WNL bilateral.    Dermatological Examination: Interspaces are clear with no open lesions noted bilateral.  Skin is shiny and atrophic bilateral.  Nails are 3-1mm thick, with yellowish/brown discoloration, subungual debris and distal onycholysis x10.  There is pain with  compression of nails x10.  There are hyperkeratotic lesions noted bilateral submet 5.  Neurological Examination: Protective sensation intact b/l LE. Vibratory sensation diminished bilateral.  Light touch sensation diminished to forefoot bilateral  Patient qualifies for at-risk foot care because of PVD.  Assessment/Plan: 1. Pain due to onychomycosis of toenails of both feet   2. Pre-ulcerative calluses   3. PVD (peripheral vascular disease) (HCC)   4. Neuropathy     Meds ordered this encounter  Medications   DULoxetine (CYMBALTA) 30 MG capsule    Sig: Take 1 capsule (30 mg total) by mouth daily. Stop taking your gabapentin when you start this medication    Dispense:  30 capsule    Refill:  3   Mycotic nails x10 were sharply debrided with sterile nail nippers and power debriding burr to decrease bulk and length.  Hyperkeratotic lesions bilateral submet 5 were shaved with #312 blade.  Discussed switching the patient from gabapentin to Cymbalta.  A prescription for 30 mg of Cymbalta 1 capsule p.o. daily was dispensed.  He was instructed on how to taper off the gabapentin before starting the Cymbalta.  He can do this over the course of the next 5 days.  He is to be cautious of any increased drowsiness while changing medications and adjusting to the new medication.  Patient to call to provide a progress report  on how he is doing with the new medication in approximately 1 month  Follow-up in 3 months for at risk footcare   Renika Shiflet D. Antion Andres, DPM, FACFAS [facility]     [address] [place], [place] [address]                Office [phone]  Fax [phone]

## 2023-03-21 ENCOUNTER — Telehealth: Payer: Self-pay | Admitting: Oncology

## 2023-03-21 NOTE — Telephone Encounter (Signed)
 Contacted pt to schedule an appt. Unable to reach via phone, voicemail was left.

## 2023-03-27 DIAGNOSIS — R918 Other nonspecific abnormal finding of lung field: Secondary | ICD-10-CM

## 2023-04-07 ENCOUNTER — Inpatient Hospital Stay: Payer: Medicare HMO

## 2023-04-07 ENCOUNTER — Inpatient Hospital Stay: Payer: Medicare HMO | Admitting: Oncology

## 2023-04-24 NOTE — Progress Notes (Unsigned)
 [facility]  [address] [place],  [place]  [address] [phone]  Clinic Day:  10/18/2022  Referring physician: Shelle Iron, MD  HISTORY OF PRESENT ILLNESS:  The patient is a 83 y.o. 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  Recent labs also showed him to be iron deficient, for which he recently received IV iron.  He comes in today to reassess his peripheral counts.  Since his last visit, the patient has been re-evaluated by hepatology, who performed another ultrasound which showed stability in his cirrhosis and splenomegaly.  Overall, the patient claims to be doing well.  He denies having increased fatigue or any overt forms of blood loss since his last visit.   PHYSICAL EXAM:  There were no vitals taken for this visit. Wt Readings from Last 3 Encounters:  10/18/22 177 lb (80.3 kg)  07/12/22 170 lb (77.1 kg)  07/06/22 169 lb 0.6 oz (76.7 kg)   There is no height or weight on file to calculate BMI. Performance status (ECOG): 0 - Asymptomatic Physical Exam Constitutional:      General: He is not in acute distress.    Appearance: Normal appearance. He is normal weight. He is not ill-appearing, toxic-appearing or diaphoretic.  HENT:     Head: Normocephalic and atraumatic.     Nose: Nose normal. No congestion or rhinorrhea.     Mouth/Throat:     Mouth: Mucous membranes are moist.     Pharynx: Oropharynx is clear. No oropharyngeal exudate or posterior oropharyngeal erythema.  Eyes:     General: No scleral icterus.       Right eye: No discharge.        Left eye: No discharge.     Extraocular Movements: Extraocular movements intact.     Conjunctiva/sclera: Conjunctivae normal.     Pupils: Pupils are equal, round, and reactive to light.  Neck:     Vascular: No carotid bruit.  Cardiovascular:     Rate and Rhythm: Normal rate and regular rhythm.     Heart sounds:  No murmur heard.    No friction rub. No gallop.  Pulmonary:     Effort: Pulmonary effort is normal. No respiratory distress.     Breath sounds: Normal breath sounds. No stridor. No wheezing, rhonchi or rales.  Chest:     Chest wall: No tenderness.  Abdominal:     General: Abdomen is flat. Bowel sounds are normal. There is no distension.     Palpations: Abdomen is soft. There is no mass.     Tenderness: There is no abdominal tenderness. There is no right CVA tenderness, left CVA tenderness, guarding or rebound.     Hernia: No hernia is present.  Musculoskeletal:        General: No swelling, tenderness, deformity or signs of injury. Normal range of motion.     Cervical back: Normal range of motion and neck supple. No rigidity or tenderness.     Right lower leg: No edema.     Left lower leg: No edema.  Lymphadenopathy:     Cervical: No cervical adenopathy.     Upper Body:     Right upper body: No supraclavicular or axillary adenopathy.     Left upper body: No supraclavicular or axillary adenopathy.     Lower Body: No right inguinal adenopathy. No left inguinal adenopathy.  Skin:    General: Skin is warm and  dry.     Capillary Refill: Capillary refill takes less than 2 seconds.     Coloration: Skin is not jaundiced or pale.     Findings: No bruising, erythema, lesion or rash.  Neurological:     General: No focal deficit present.     Mental Status: He is alert and oriented to person, place, and time. Mental status is at baseline.     Cranial Nerves: No cranial nerve deficit.     Sensory: No sensory deficit.     Motor: No weakness.     Coordination: Coordination normal.     Gait: Gait normal.     Deep Tendon Reflexes: Reflexes normal.  Psychiatric:        Mood and Affect: Mood normal.        Behavior: Behavior normal.        Thought Content: Thought content normal.        Judgment: Judgment normal.    LABS:      Latest Ref Rng & Units 10/18/2022   12:00 AM 06/17/2022   12:00 AM  10/27/2021   12:00 AM  CBC  WBC  2.1     3.5     2.5      Hemoglobin 13.5 - 17.5 10.0     10.7     11.3      Hematocrit 41 - 53 30     33     34      Platelets 150 - 400 K/uL 88     104     82         This result is from an external source.     Latest Reference Range & Units 10/18/22 10:13  Iron 45 - 182 ug/dL 38 (L)  UIBC ug/dL 811  TIBC 914 - 782 ug/dL 956  Saturation Ratios 17.9 - 39.5 % 9 (L)  Ferritin 24 - 336 ng/mL 18 (L)  (L): Data is abnormally low  ASSESSMENT & PLAN:  Assessment/Plan:  An 83 y.o. male with pancytopenia due to his cirrhosis and secondary splenomegaly.  When evaluating his labs today, they are not much different than what they have been previously.  His iron studies show that he is still very iron deficient.  Based upon this, I will arrange for him to receive another course of IV iron.  The major question is whether he may be having GI blood loss from potential gastroesophageal varices related to his cirrhosis. Ultimately, this gentleman may need an EGD +/- colonoscopy to rule out such a disease process.  He understands that with his cirrhosis/splenomegaly, pancytopenia will likely always be present moving forward.  As he is clinically stable, I will see him back in 3 months for repeat clinical assessment.  The patient understands all the plans discussed today and is in agreement with them.    [place] Funk, MD

## 2023-04-25 ENCOUNTER — Inpatient Hospital Stay: Attending: Oncology | Admitting: Oncology

## 2023-04-25 ENCOUNTER — Other Ambulatory Visit: Payer: Self-pay | Admitting: Oncology

## 2023-04-25 ENCOUNTER — Telehealth: Payer: Self-pay | Admitting: Oncology

## 2023-04-25 ENCOUNTER — Telehealth: Payer: Self-pay

## 2023-04-25 ENCOUNTER — Inpatient Hospital Stay

## 2023-04-25 ENCOUNTER — Other Ambulatory Visit: Payer: Self-pay

## 2023-04-25 VITALS — BP 139/70 | HR 73 | Temp 98.1°F | Resp 18 | Ht 67.0 in | Wt 169.0 lb

## 2023-04-25 DIAGNOSIS — D61818 Other pancytopenia: Secondary | ICD-10-CM

## 2023-04-25 DIAGNOSIS — D508 Other iron deficiency anemias: Secondary | ICD-10-CM

## 2023-04-25 DIAGNOSIS — K7460 Unspecified cirrhosis of liver: Secondary | ICD-10-CM | POA: Diagnosis not present

## 2023-04-25 DIAGNOSIS — R161 Splenomegaly, not elsewhere classified: Secondary | ICD-10-CM | POA: Insufficient documentation

## 2023-04-25 LAB — IRON AND TIBC
Iron: 93 ug/dL (ref 45–182)
Saturation Ratios: 25 % (ref 17.9–39.5)
TIBC: 368 ug/dL (ref 250–450)
UIBC: 275 ug/dL

## 2023-04-25 LAB — CBC WITH DIFFERENTIAL (CANCER CENTER ONLY)
Abs Immature Granulocytes: 0.01 10*3/uL (ref 0.00–0.07)
Basophils Absolute: 0 10*3/uL (ref 0.0–0.1)
Basophils Relative: 1 %
Eosinophils Absolute: 0.1 10*3/uL (ref 0.0–0.5)
Eosinophils Relative: 3 %
HCT: 32.2 % — ABNORMAL LOW (ref 39.0–52.0)
Hemoglobin: 10 g/dL — ABNORMAL LOW (ref 13.0–17.0)
Immature Granulocytes: 0 %
Immature Platelet Fraction: 2.2 % (ref 1.2–8.6)
Lymphocytes Relative: 10 %
Lymphs Abs: 0.4 10*3/uL — ABNORMAL LOW (ref 0.7–4.0)
MCH: 27.8 pg (ref 26.0–34.0)
MCHC: 31.1 g/dL (ref 30.0–36.0)
MCV: 89.4 fL (ref 80.0–100.0)
Monocytes Absolute: 0.2 10*3/uL (ref 0.1–1.0)
Monocytes Relative: 7 %
Neutro Abs: 2.6 10*3/uL (ref 1.7–7.7)
Neutrophils Relative %: 79 %
Platelet Count: 94 10*3/uL — ABNORMAL LOW (ref 150–400)
RBC: 3.6 MIL/uL — ABNORMAL LOW (ref 4.22–5.81)
RDW: 22.5 % — ABNORMAL HIGH (ref 11.5–15.5)
WBC Count: 3.4 10*3/uL — ABNORMAL LOW (ref 4.0–10.5)
nRBC: 0 % (ref 0.0–0.2)
nRBC: 0 /100{WBCs}

## 2023-04-25 LAB — VITAMIN B12: Vitamin B-12: 2205 pg/mL — ABNORMAL HIGH (ref 180–914)

## 2023-04-25 LAB — FOLATE: Folate: 11.7 ng/mL (ref >5.9)

## 2023-04-25 LAB — FERRITIN: Ferritin: 29 ng/mL (ref 24–336)

## 2023-04-25 NOTE — Telephone Encounter (Signed)
 Latest Reference Range & Units 04/25/23 09:02  Iron 45 - 182 ug/dL 93  UIBC ug/dL 284  TIBC 132 - 440 ug/dL 102  Saturation Ratios 17.9 - 39.5 % 25  Ferritin 24 - 336 ng/mL 29

## 2023-04-25 NOTE — Telephone Encounter (Signed)
 Patient has been scheduled for follow-up visit per 04/25/23 LOS.  Pt given an appt calendar with date and time.

## 2023-06-02 ENCOUNTER — Ambulatory Visit: Payer: Medicare HMO | Admitting: Podiatry

## 2023-07-16 DEATH — deceased

## 2023-08-25 ENCOUNTER — Ambulatory Visit: Admitting: Oncology

## 2023-08-25 ENCOUNTER — Other Ambulatory Visit
